# Patient Record
Sex: Male | Born: 1935 | Race: White | Hispanic: No | Marital: Married | Smoking: Former smoker
Health system: Southern US, Community
[De-identification: ages and names within clinical notes are randomized; demographics above are authoritative.]

## PROBLEM LIST (undated history)

## (undated) DIAGNOSIS — J45909 Unspecified asthma, uncomplicated: Secondary | ICD-10-CM

## (undated) DIAGNOSIS — F32A Depression, unspecified: Secondary | ICD-10-CM

## (undated) DIAGNOSIS — Z87898 Personal history of other specified conditions: Secondary | ICD-10-CM

## (undated) DIAGNOSIS — Z87448 Personal history of other diseases of urinary system: Secondary | ICD-10-CM

## (undated) DIAGNOSIS — Z955 Presence of coronary angioplasty implant and graft: Secondary | ICD-10-CM

## (undated) DIAGNOSIS — F329 Major depressive disorder, single episode, unspecified: Secondary | ICD-10-CM

## (undated) DIAGNOSIS — I10 Essential (primary) hypertension: Secondary | ICD-10-CM

## (undated) DIAGNOSIS — I519 Heart disease, unspecified: Secondary | ICD-10-CM

## (undated) DIAGNOSIS — I714 Abdominal aortic aneurysm, without rupture: Secondary | ICD-10-CM

## (undated) DIAGNOSIS — E7800 Pure hypercholesterolemia, unspecified: Secondary | ICD-10-CM

## (undated) HISTORY — DX: Personal history of other specified conditions: Z87.898

## (undated) HISTORY — DX: Essential (primary) hypertension: I10

## (undated) HISTORY — DX: Unspecified asthma, uncomplicated: J45.909

## (undated) HISTORY — PX: ABDOMINAL AORTIC ANEURYSM REPAIR: SUR1152

## (undated) HISTORY — PX: CORONARY ANGIOPLASTY WITH STENT PLACEMENT: SHX49

## (undated) HISTORY — DX: Personal history of other diseases of urinary system: Z87.448

## (undated) HISTORY — DX: Heart disease, unspecified: I51.9

## (undated) HISTORY — DX: Depression, unspecified: F32.A

## (undated) HISTORY — DX: Abdominal aortic aneurysm, without rupture: I71.4

## (undated) HISTORY — DX: Pure hypercholesterolemia, unspecified: E78.00

## (undated) HISTORY — DX: Presence of coronary angioplasty implant and graft: Z95.5

## (undated) HISTORY — DX: Major depressive disorder, single episode, unspecified: F32.9

---

## 2004-01-02 HISTORY — PX: TOTAL HIP ARTHROPLASTY: SHX124

## 2015-06-13 DIAGNOSIS — I10 Essential (primary) hypertension: Secondary | ICD-10-CM

## 2015-06-13 DIAGNOSIS — E7800 Pure hypercholesterolemia, unspecified: Secondary | ICD-10-CM

## 2015-06-13 DIAGNOSIS — Z955 Presence of coronary angioplasty implant and graft: Secondary | ICD-10-CM

## 2015-06-13 DIAGNOSIS — I714 Abdominal aortic aneurysm, without rupture, unspecified: Secondary | ICD-10-CM | POA: Insufficient documentation

## 2015-06-13 HISTORY — DX: Abdominal aortic aneurysm, without rupture: I71.4

## 2015-06-13 HISTORY — DX: Essential (primary) hypertension: I10

## 2015-06-13 HISTORY — DX: Presence of coronary angioplasty implant and graft: Z95.5

## 2015-06-13 HISTORY — DX: Abdominal aortic aneurysm, without rupture, unspecified: I71.40

## 2015-06-13 HISTORY — DX: Pure hypercholesterolemia, unspecified: E78.00

## 2015-06-24 ENCOUNTER — Encounter: Payer: Self-pay | Admitting: Urology

## 2015-06-24 ENCOUNTER — Ambulatory Visit (INDEPENDENT_AMBULATORY_CARE_PROVIDER_SITE_OTHER): Payer: Medicare Other | Admitting: Urology

## 2015-06-24 VITALS — BP 114/74 | HR 79 | Ht 70.0 in | Wt 211.9 lb

## 2015-06-24 DIAGNOSIS — R310 Gross hematuria: Secondary | ICD-10-CM

## 2015-06-24 DIAGNOSIS — R972 Elevated prostate specific antigen [PSA]: Secondary | ICD-10-CM | POA: Insufficient documentation

## 2015-06-24 DIAGNOSIS — N400 Enlarged prostate without lower urinary tract symptoms: Secondary | ICD-10-CM | POA: Insufficient documentation

## 2015-06-24 LAB — URINALYSIS, COMPLETE
BILIRUBIN UA: NEGATIVE
Glucose, UA: NEGATIVE
KETONES UA: NEGATIVE
LEUKOCYTES UA: NEGATIVE
Nitrite, UA: NEGATIVE
PROTEIN UA: NEGATIVE
Specific Gravity, UA: <1.005 — ABNORMAL LOW (ref 1.005–1.030)
UUROB: 0.2 mg/dL (ref 0.2–1.0)
pH, UA: 6 (ref 5.0–7.5)

## 2015-06-24 LAB — BLADDER SCAN AMB NON-IMAGING: Scan Result: 55

## 2015-06-24 LAB — MICROSCOPIC EXAMINATION
Bacteria, UA: NONE SEEN
Epithelial Cells (non renal): NONE SEEN /hpf (ref 0–10)
WBC, UA: NONE SEEN /hpf (ref 0–?)

## 2015-06-24 NOTE — Progress Notes (Signed)
06/24/2015 11:19 AM   Daralene Milchoundell Marling 04/26/1932 [id number]  Referring provider: No referring provider defined for this encounter.  Chief Complaint  Patient presents with  . Establish Care    New Patient    HPI: Mr Tiburcio PeaHarris is an 82yo see today to establish care. He was previously seen by a urologist for elevated PSA and is s/p biopsy x2 which are negative per patient. Last biopsy was 4 years ago. Pt does not recall his PSA values. He also has a hx of gross hematuria which per patient he had a negative workup 8 months ago.  He is on finasteride, terazosin, and flomax for BPH. He has nocturia 1-2x but no other LUTS.   He has been on finasteride for about 1 year due to the hematuria.    PMH: Past Medical History  Diagnosis Date  . Essential (primary) hypertension 06/13/2015  . Abdominal aortic aneurysm (AAA) without rupture (HCC) 06/13/2015  . Pure hypercholesterolemia 06/13/2015  . Presence of coronary angioplasty implant and graft 06/13/2015    Overview:  Prox RCA, 03/01/2008   . Asthma   . Depression   . Heart disease   . History of elevated PSA     x2 neg. biopsy x 4 yrs ago  . History of hematuria     cysto x 8mths ago, (-)neg. hematuria work      Surgical History: Past Surgical History  Procedure Laterality Date  . Total hip arthroplasty  2006  . Coronary angioplasty with stent placement    . Abdominal aortic aneurysm repair      Home Medications:    Medication List       This list is accurate as of: 06/24/15 11:19 AM.  Always use your most recent med list.               aspirin EC 81 MG tablet  Take by mouth.     atorvastatin 10 MG tablet  Commonly known as:  LIPITOR  Take by mouth.     busPIRone 5 MG tablet  Commonly known as:  BUSPAR  Take by mouth.     finasteride 5 MG tablet  Commonly known as:  PROSCAR  Take by mouth.     losartan 50 MG tablet  Commonly known as:  COZAAR  Take by mouth.     tamsulosin 0.4 MG Caps capsule  Commonly known  as:  FLOMAX  Take by mouth.     terazosin 2 MG capsule  Commonly known as:  HYTRIN  Take by mouth.        Allergies:  Allergies  Allergen Reactions  . Simvastatin     Other reaction(s): Unknown Patient unsure if medication give him a rash or a headache    Family History: Family History  Problem Relation Age of Onset  . Bladder Cancer Neg Hx   . Kidney cancer Neg Hx   . Prostate cancer Brother     Social History:  reports that he has quit smoking. He does not have any smokeless tobacco history on file. His alcohol and drug histories are not on file.  ROS: UROLOGY Frequent Urination?: No Hard to postpone urination?: No Burning/pain with urination?: No Get up at night to urinate?: Yes Leakage of urine?: No Urine stream starts and stops?: No Trouble starting stream?: No Do you have to strain to urinate?: No Blood in urine?: No Urinary tract infection?: No Sexually transmitted disease?: No Injury to kidneys or bladder?: No Painful intercourse?: No Weak stream?:  No Erection problems?: Yes Penile pain?: No  Gastrointestinal Nausea?: No Vomiting?: No Indigestion/heartburn?: No Diarrhea?: No Constipation?: No  Constitutional Fever: No Night sweats?: No Weight loss?: No Fatigue?: No  Skin Skin rash/lesions?: No Itching?: No  Eyes Blurred vision?: No Double vision?: No  Ears/Nose/Throat Sore throat?: No Sinus problems?: Yes  Hematologic/Lymphatic Swollen glands?: No Easy bruising?: No  Cardiovascular Leg swelling?: No Chest pain?: No  Respiratory Cough?: No Shortness of breath?: No  Endocrine Excessive thirst?: No  Musculoskeletal Back pain?: Yes Joint pain?: Yes  Neurological Headaches?: No Dizziness?: No  Psychologic Depression?: Yes Anxiety?: No  Physical Exam: BP 114/74 mmHg  Pulse 79  Ht 5\' 10"  (1.778 m)  Wt 96.117 kg (211 lb 14.4 oz)  BMI 30.40 kg/m2  Constitutional:  Alert and oriented, No acute distress. HEENT: [place]  AT, moist mucus membranes.  Trachea midline, no masses. Cardiovascular: No clubbing, cyanosis, or edema. Respiratory: Normal respiratory effort, no increased work of breathing. GI: Abdomen is soft, nontender, nondistended, no abdominal masses GU: No CVA tenderness.  Uncircumcised phallus, no masses/lesion on penis, testes, scrotum. Prostate >100g smooth, no induration no nodules. Normal rectal tone Skin: No rashes, bruises or suspicious lesions. Lymph: No cervical or inguinal adenopathy. Neurologic: Grossly intact, no focal deficits, moving all 4 extremities. Psychiatric: Normal mood and affect.  Laboratory Data: No results found for: WBC, HGB, HCT, MCV, PLT  No results found for: CREATININE  No results found for: PSA  No results found for: TESTOSTERONE  No results found for: HGBA1C  Urinalysis No results found for: COLORURINE, APPEARANCEUR, LABSPEC, PHURINE, GLUCOSEU, HGBUR, BILIRUBINUR, KETONESUR, PROTEINUR, UROBILINOGEN, NITRITE, LEUKOCYTESUR  Pertinent Imaging: none  Assessment & Plan:    1. BPH (benign prostatic hyperplasia) -Continue finasteride, flomax, terazosin - Urinalysis, Complete - Bladder Scan (Post Void Residual) in office  2. Gross hematuria -Ua today normal -Will request records from previous Dr. Alecia Lemmingeisman from [place].   3. Elevated PSA -check PSA today  No Follow-up on file.  Wilkie AyePatrick Paisely Brick, MD  [facility] [address], [address] [place], [place] [address] [phone]

## 2015-06-25 LAB — PSA: Prostate Specific Ag, Serum: 2.5 ng/mL (ref 0.0–4.0)

## 2015-09-26 ENCOUNTER — Other Ambulatory Visit: Payer: Self-pay

## 2015-09-26 DIAGNOSIS — R310 Gross hematuria: Secondary | ICD-10-CM

## 2015-09-27 ENCOUNTER — Other Ambulatory Visit: Payer: Medicare Other

## 2015-09-27 DIAGNOSIS — R310 Gross hematuria: Secondary | ICD-10-CM

## 2015-09-27 LAB — URINALYSIS, COMPLETE
Bilirubin, UA: NEGATIVE
GLUCOSE, UA: NEGATIVE
Ketones, UA: NEGATIVE
Leukocytes, UA: NEGATIVE
NITRITE UA: NEGATIVE
Protein, UA: NEGATIVE
RBC, UA: NEGATIVE
Specific Gravity, UA: 1.02 (ref 1.005–1.030)
Urobilinogen, Ur: 0.2 mg/dL (ref 0.2–1.0)
pH, UA: 5.5 (ref 5.0–7.5)

## 2015-09-27 LAB — MICROSCOPIC EXAMINATION
BACTERIA UA: NONE SEEN
Epithelial Cells (non renal): NONE SEEN /hpf (ref 0–10)
WBC, UA: NONE SEEN /hpf (ref 0–?)

## 2017-02-26 ENCOUNTER — Other Ambulatory Visit (HOSPITAL_COMMUNITY): Payer: Self-pay | Admitting: Family Medicine

## 2017-02-26 DIAGNOSIS — Z9889 Other specified postprocedural states: Secondary | ICD-10-CM

## 2017-02-26 DIAGNOSIS — Z136 Encounter for screening for cardiovascular disorders: Secondary | ICD-10-CM

## 2017-03-04 ENCOUNTER — Other Ambulatory Visit (HOSPITAL_COMMUNITY): Payer: Self-pay | Admitting: Family Medicine

## 2017-03-04 DIAGNOSIS — I10 Essential (primary) hypertension: Secondary | ICD-10-CM

## 2017-03-04 DIAGNOSIS — Z9889 Other specified postprocedural states: Secondary | ICD-10-CM

## 2017-03-05 ENCOUNTER — Ambulatory Visit
Admission: RE | Admit: 2017-03-05 | Discharge: 2017-03-05 | Disposition: A | Discharge status: Home / Self Care | Payer: Medicare Other | Source: Ambulatory Visit | Attending: Family Medicine | Admitting: Family Medicine

## 2017-03-05 ENCOUNTER — Encounter: Payer: Self-pay | Admitting: Radiology

## 2017-03-05 DIAGNOSIS — I714 Abdominal aortic aneurysm, without rupture: Secondary | ICD-10-CM | POA: Insufficient documentation

## 2017-03-05 DIAGNOSIS — Z136 Encounter for screening for cardiovascular disorders: Secondary | ICD-10-CM | POA: Diagnosis not present

## 2017-03-05 DIAGNOSIS — I10 Essential (primary) hypertension: Secondary | ICD-10-CM

## 2017-03-05 DIAGNOSIS — Z8679 Personal history of other diseases of the circulatory system: Secondary | ICD-10-CM | POA: Diagnosis present

## 2017-03-05 DIAGNOSIS — Z9889 Other specified postprocedural states: Secondary | ICD-10-CM

## 2019-04-10 ENCOUNTER — Ambulatory Visit: Payer: Medicare HMO | Attending: Internal Medicine

## 2019-04-10 DIAGNOSIS — Z23 Encounter for immunization: Secondary | ICD-10-CM

## 2019-04-10 NOTE — Progress Notes (Signed)
   Covid-19 Vaccination Clinic  Name:  Timothy Giles    MRN: [id number] DOB: April 21, 1932  04/10/2019  Timothy Giles was observed post Covid-19 immunization for 15 minutes without incident. He was provided with Vaccine Information Sheet and instruction to access the V-Safe system.   Timothy Giles was instructed to call 911 with any severe reactions post vaccine: Marland Kitchen Difficulty breathing  . Swelling of face and throat  . A fast heartbeat  . A bad rash all over body  . Dizziness and weakness   Immunizations Administered    Name Date Dose VIS Date Route   Pfizer COVID-19 Vaccine 04/10/2019  9:52 AM 0.3 mL 12/12/2018 Intramuscular   Manufacturer: [employer], [employer]   Lot: [id number]   NDC: [address]-2

## 2019-05-05 ENCOUNTER — Ambulatory Visit: Payer: Medicare HMO | Attending: Internal Medicine

## 2019-05-05 DIAGNOSIS — Z23 Encounter for immunization: Secondary | ICD-10-CM

## 2019-05-05 NOTE — Progress Notes (Signed)
   Covid-19 Vaccination Clinic  Name:  Akshith Moncus    MRN: [id number] DOB: 1932-10-25  05/05/2019  Mr. Vastine was observed post Covid-19 immunization for 15 minutes without incident. He was provided with Vaccine Information Sheet and instruction to access the V-Safe system.   Mr. Maslow was instructed to call 911 with any severe reactions post vaccine: Marland Kitchen Difficulty breathing  . Swelling of face and throat  . A fast heartbeat  . A bad rash all over body  . Dizziness and weakness   Immunizations Administered    Name Date Dose VIS Date Route   Pfizer COVID-19 Vaccine 05/05/2019  1:54 PM 0.3 mL 02/25/2018 Intramuscular   Manufacturer: [employer], [employer]   Lot: [id number]   NDC: [address]-9

## 2019-12-21 ENCOUNTER — Ambulatory Visit (INDEPENDENT_AMBULATORY_CARE_PROVIDER_SITE_OTHER): Payer: Medicare HMO

## 2019-12-21 ENCOUNTER — Other Ambulatory Visit: Payer: Self-pay

## 2019-12-21 ENCOUNTER — Ambulatory Visit
Admission: EM | Admit: 2019-12-21 | Discharge: 2019-12-21 | Disposition: A | Discharge status: Home / Self Care | Payer: Medicare HMO | Attending: Emergency Medicine | Admitting: Emergency Medicine

## 2019-12-21 DIAGNOSIS — I517 Cardiomegaly: Secondary | ICD-10-CM

## 2019-12-21 DIAGNOSIS — R059 Cough, unspecified: Secondary | ICD-10-CM

## 2019-12-21 DIAGNOSIS — J189 Pneumonia, unspecified organism: Secondary | ICD-10-CM

## 2019-12-21 MED ORDER — AMOXICILLIN-POT CLAVULANATE 875-125 MG PO TABS: 1.0000 | ORAL_TABLET | Freq: Two times a day (BID) | ORAL | 0 refills | Status: AC

## 2019-12-21 MED ORDER — PREDNISONE 20 MG PO TABS: 40.0000 mg | ORAL_TABLET | Freq: Every day | ORAL | 0 refills | Status: AC

## 2019-12-21 NOTE — ED Provider Notes (Signed)
Renaldo Fiddler    CSN: [id number] Arrival date & time: 12/21/19  1117      History   Chief Complaint Chief Complaint  Patient presents with  . Cough    HPI Ebon Sadlon is a 86 y.o. male.   Ell Dauria presents with complaints of cough for the past three days which is productive. Started out with congestion and bilateral eye tearing. Now more of a cough with yellow phlegm. Denies chest pain  Or shortness of breath . His wife is ill with cough as well. No history of covid-19 and has not received vaccination. States he uses an advair diskus daily for asthma. States he is unsure of what his baseline o2 sats are. No known fevers or chills. No gi symptoms.    ROS per HPI, negative if not otherwise mentioned.      Past Medical History:  Diagnosis Date  . Abdominal aortic aneurysm (AAA) without rupture (HCC) 06/13/2015  . Asthma   . Depression   . Essential (primary) hypertension 06/13/2015  . Heart disease   . History of elevated PSA    x2 neg. biopsy x 4 yrs ago  . History of hematuria    cysto x ago, (-)neg. hematuria work    . Presence of coronary angioplasty implant and graft 06/13/2015   Overview:  Prox RCA, 03/01/2008   . Pure hypercholesterolemia 06/13/2015    Patient Active Problem List   Diagnosis Date Noted  . BPH (benign prostatic hyperplasia) 06/24/2015  . Gross hematuria 06/24/2015  . Elevated PSA 06/24/2015  . Abdominal aortic aneurysm (AAA) without rupture (HCC) 06/13/2015  . Essential (primary) hypertension 06/13/2015  . Pure hypercholesterolemia 06/13/2015  . Presence of coronary angioplasty implant and graft 06/13/2015    Past Surgical History:  Procedure Laterality Date  . ABDOMINAL AORTIC ANEURYSM REPAIR    . CORONARY ANGIOPLASTY WITH STENT PLACEMENT    . TOTAL HIP ARTHROPLASTY  2006       Home Medications    Prior to Admission medications   Medication Sig Start Date End Date Taking? Authorizing Provider   amoxicillin-clavulanate (AUGMENTIN) 875-125 MG tablet Take 1 tablet by mouth every 12 (twelve) hours for 10 days. 12/21/19 12/31/19  Georgetta Haber, NP  aspirin EC 81 MG tablet Take by mouth.    [provider]  atorvastatin (LIPITOR) 10 MG tablet Take by mouth.    [provider]  busPIRone (BUSPAR) 5 MG tablet Take by mouth.    [provider]  finasteride (PROSCAR) 5 MG tablet Take by mouth.    [provider]  losartan (COZAAR) 50 MG tablet Take by mouth.    [provider]  predniSONE (DELTASONE) 20 MG tablet Take 2 tablets (40 mg total) by mouth daily with breakfast for 5 days. 12/21/19 12/26/19  Georgetta Haber, NP  tamsulosin (FLOMAX) 0.4 MG CAPS capsule Take by mouth.    [provider]  terazosin (HYTRIN) 2 MG capsule Take by mouth.    [provider]    Family History Family History  Problem Relation Age of Onset  . Prostate cancer Brother   . Bladder Cancer Neg Hx   . Kidney cancer Neg Hx     Social History Social History   Tobacco Use  . Smoking status: Former Games developer  . Smokeless tobacco: Never Used  Substance Use Topics  . Alcohol use: Never    Alcohol/week: 0.0 standard drinks  . Drug use: Never     Allergies  Simvastatin   Review of Systems Review of Systems   Physical Exam Triage Vital Signs ED Triage Vitals [12/21/19 1130]  Enc Vitals Group     BP (!) 144/81     Pulse Rate 89     Resp 18     Temp 98 F (36.7 C)     Temp Source Oral     SpO2 92 %     Weight 211 lb 13.8 oz (96.1 kg)     Height 5\' 10"  (1.778 m)     Head Circumference      Peak Flow      Pain Score 0     Pain Loc      Pain Edu?      Excl. in GC?    No data found.  Updated Vital Signs BP (!) 144/81   Pulse 89   Temp 98 F (36.7 C) (Oral)   Resp 18   Ht 5\' 10"  (1.778 m)   Wt 211 lb 13.8 oz (96.1 kg)   SpO2 92%   BMI 30.40 kg/m   Visual Acuity Right Eye Distance:   Left Eye Distance:   Bilateral  Distance:    Right Eye Near:   Left Eye Near:    Bilateral Near:     Physical Exam Constitutional:      General: He is not in acute distress.    Appearance: He is well-developed. He is not ill-appearing or toxic-appearing.  HENT:     Nose: Rhinorrhea present.  Cardiovascular:     Rate and Rhythm: Normal rate and regular rhythm.  Pulmonary:     Effort: Pulmonary effort is normal.     Breath sounds: Normal breath sounds.  Skin:    General: Skin is warm and dry.  Neurological:     Mental Status: He is alert and oriented to person, place, and time.      UC Treatments / Results  Labs (all labs ordered are listed, but only abnormal results are displayed) Labs Reviewed  COVID-19, FLU A+B NAA    EKG   Radiology No results found.  Procedures Procedures (including critical care time)  Medications Ordered in UC Medications - No data to display  Initial Impression / Assessment and Plan / UC Course  I have reviewed the triage vital signs and the nursing notes.  Pertinent labs & imaging results that were available during my care of the patient were reviewed by me and considered in my medical decision making (see chart for details).     Asthma history, per chart review with care everywhere last documented o2 with pcp in September of this year was 94%. No shortness of breath. No shortness of breath . bp and heart rate are stable, afebrile. Wife also ill. Covid testing pending and isolation instructions provided.  Diabetic with cardiac history as well, opted to provide empiric coverage with augmentin today, pneumonitis on cxr. 5 days of prednisone as well. Er precautions provided. Patient and wife verbalized understanding and agreeable to plan.  Ambulatory out of clinic without difficulty.   Final Clinical Impressions(s) / UC Diagnoses   Final diagnoses:  Cough  Pneumonitis     Discharge Instructions     Complete course of antibiotics.   5 days of prednisone as well.   Push fluids to ensure adequate hydration and keep secretions thin.  Over the counter  medications as needed for symptoms.  Self isolate until covid results are back and negative.  Will notify you by phone of  any positive findings. Your negative results will be sent through your MyChart.     Please return or go to the ER for any worsening of symptoms.     ED Prescriptions    Medication Sig Dispense Auth. Provider   amoxicillin-clavulanate (AUGMENTIN) 875-125 MG tablet Take 1 tablet by mouth every 12 (twelve) hours for 10 days. 20 tablet Linus Mako B, NP   predniSONE (DELTASONE) 20 MG tablet Take 2 tablets (40 mg total) by mouth daily with breakfast for 5 days. 10 tablet Georgetta Haber, NP     PDMP not reviewed this encounter.   Georgetta Haber, NP 12/21/19 1233

## 2019-12-21 NOTE — ED Triage Notes (Signed)
Pt reports having a productive cough x2 days. sts he has been taking mucinex q12. No known sick contacts.

## 2019-12-21 NOTE — Discharge Instructions (Signed)
Complete course of antibiotics.   5 days of prednisone as well.  Push fluids to ensure adequate hydration and keep secretions thin.  Over the counter  medications as needed for symptoms.  Self isolate until covid results are back and negative.  Will notify you by phone of any positive findings. Your negative results will be sent through your MyChart.     Please return or go to the ER for any worsening of symptoms.

## 2019-12-24 LAB — COVID-19, FLU A+B NAA
Influenza A, NAA: NOT DETECTED
Influenza B, NAA: NOT DETECTED
SARS-CoV-2, NAA: NOT DETECTED

## 2020-02-23 ENCOUNTER — Other Ambulatory Visit: Payer: Self-pay

## 2020-02-23 ENCOUNTER — Emergency Department: Payer: Medicare HMO

## 2020-02-23 ENCOUNTER — Inpatient Hospital Stay
Admission: EM | Admit: 2020-02-23 | Discharge: 2020-02-28 | DRG: 177 | Disposition: A | Discharge status: Home Health | Payer: Medicare HMO | Attending: Internal Medicine | Admitting: Internal Medicine

## 2020-02-23 ENCOUNTER — Inpatient Hospital Stay: Payer: Medicare HMO

## 2020-02-23 DIAGNOSIS — I4510 Unspecified right bundle-branch block: Secondary | ICD-10-CM | POA: Diagnosis present

## 2020-02-23 DIAGNOSIS — F419 Anxiety disorder, unspecified: Secondary | ICD-10-CM | POA: Diagnosis present

## 2020-02-23 DIAGNOSIS — R0902 Hypoxemia: Secondary | ICD-10-CM | POA: Diagnosis present

## 2020-02-23 DIAGNOSIS — I5021 Acute systolic (congestive) heart failure: Secondary | ICD-10-CM | POA: Diagnosis not present

## 2020-02-23 DIAGNOSIS — Z955 Presence of coronary angioplasty implant and graft: Secondary | ICD-10-CM

## 2020-02-23 DIAGNOSIS — I10 Essential (primary) hypertension: Secondary | ICD-10-CM | POA: Diagnosis present

## 2020-02-23 DIAGNOSIS — Z888 Allergy status to other drugs, medicaments and biological substances status: Secondary | ICD-10-CM | POA: Diagnosis not present

## 2020-02-23 DIAGNOSIS — J189 Pneumonia, unspecified organism: Secondary | ICD-10-CM | POA: Diagnosis present

## 2020-02-23 DIAGNOSIS — Z7982 Long term (current) use of aspirin: Secondary | ICD-10-CM

## 2020-02-23 DIAGNOSIS — N400 Enlarged prostate without lower urinary tract symptoms: Secondary | ICD-10-CM | POA: Diagnosis present

## 2020-02-23 DIAGNOSIS — F32A Depression, unspecified: Secondary | ICD-10-CM | POA: Diagnosis present

## 2020-02-23 DIAGNOSIS — I2510 Atherosclerotic heart disease of native coronary artery without angina pectoris: Secondary | ICD-10-CM | POA: Diagnosis present

## 2020-02-23 DIAGNOSIS — Z87891 Personal history of nicotine dependence: Secondary | ICD-10-CM | POA: Diagnosis not present

## 2020-02-23 DIAGNOSIS — R000 Tachycardia, unspecified: Secondary | ICD-10-CM | POA: Diagnosis present

## 2020-02-23 DIAGNOSIS — J1282 Pneumonia due to coronavirus disease 2019: Secondary | ICD-10-CM | POA: Diagnosis present

## 2020-02-23 DIAGNOSIS — E785 Hyperlipidemia, unspecified: Secondary | ICD-10-CM | POA: Diagnosis present

## 2020-02-23 DIAGNOSIS — Z79899 Other long term (current) drug therapy: Secondary | ICD-10-CM | POA: Diagnosis not present

## 2020-02-23 DIAGNOSIS — Z96649 Presence of unspecified artificial hip joint: Secondary | ICD-10-CM | POA: Diagnosis present

## 2020-02-23 DIAGNOSIS — E7800 Pure hypercholesterolemia, unspecified: Secondary | ICD-10-CM | POA: Diagnosis present

## 2020-02-23 DIAGNOSIS — J9601 Acute respiratory failure with hypoxia: Secondary | ICD-10-CM

## 2020-02-23 DIAGNOSIS — U071 COVID-19: Secondary | ICD-10-CM | POA: Diagnosis present

## 2020-02-23 DIAGNOSIS — J45901 Unspecified asthma with (acute) exacerbation: Secondary | ICD-10-CM | POA: Diagnosis present

## 2020-02-23 DIAGNOSIS — I714 Abdominal aortic aneurysm, without rupture: Secondary | ICD-10-CM | POA: Diagnosis present

## 2020-02-23 LAB — CBC WITH DIFFERENTIAL/PLATELET
Abs Immature Granulocytes: 0.05 10*3/uL (ref 0.00–0.07)
Basophils Absolute: 0 10*3/uL (ref 0.0–0.1)
Basophils Relative: 0 %
Eosinophils Absolute: 0.1 10*3/uL (ref 0.0–0.5)
Eosinophils Relative: 1 %
HCT: 42.9 % (ref 39.0–52.0)
Hemoglobin: 14.4 g/dL (ref 13.0–17.0)
Immature Granulocytes: 1 %
Lymphocytes Relative: 4 %
Lymphs Abs: 0.3 10*3/uL — ABNORMAL LOW (ref 0.7–4.0)
MCH: 27.9 pg (ref 26.0–34.0)
MCHC: 33.6 g/dL (ref 30.0–36.0)
MCV: 83.1 fL (ref 80.0–100.0)
Monocytes Absolute: 0.5 10*3/uL (ref 0.1–1.0)
Monocytes Relative: 7 %
Neutro Abs: 6.5 10*3/uL (ref 1.7–7.7)
Neutrophils Relative %: 87 %
Platelets: 242 10*3/uL (ref 150–400)
RBC: 5.16 MIL/uL (ref 4.22–5.81)
RDW: 14.1 % (ref 11.5–15.5)
WBC: 7.4 10*3/uL (ref 4.0–10.5)
nRBC: 0 % (ref 0.0–0.2)

## 2020-02-23 LAB — LACTIC ACID, PLASMA
Lactic Acid, Venous: 1.4 mmol/L (ref 0.5–1.9)
Lactic Acid, Venous: 1.2 mmol/L (ref 0.5–1.9)

## 2020-02-23 LAB — SARS CORONAVIRUS 2 (TAT 6-24 HRS): SARS Coronavirus 2: POSITIVE — AB

## 2020-02-23 LAB — BRAIN NATRIURETIC PEPTIDE: B Natriuretic Peptide: 236.8 pg/mL — ABNORMAL HIGH (ref 0.0–100.0)

## 2020-02-23 LAB — BASIC METABOLIC PANEL
Anion gap: 13 (ref 5–15)
BUN: 20 mg/dL (ref 8–23)
CO2: 18 mmol/L — ABNORMAL LOW (ref 22–32)
Calcium: 8.9 mg/dL (ref 8.9–10.3)
Chloride: 106 mmol/L (ref 98–111)
Creatinine, Ser: 1.07 mg/dL (ref 0.61–1.24)
GFR, Estimated: >60 mL/min (ref >60)
Glucose, Bld: 124 mg/dL — ABNORMAL HIGH (ref 70–99)
Potassium: 3.8 mmol/L (ref 3.5–5.1)
Sodium: 137 mmol/L (ref 135–145)

## 2020-02-23 LAB — STREP PNEUMONIAE URINARY ANTIGEN: Strep Pneumo Urinary Antigen: NEGATIVE

## 2020-02-23 LAB — EXPECTORATED SPUTUM ASSESSMENT W GRAM STAIN, RFLX TO RESP C

## 2020-02-23 LAB — PROCALCITONIN: Procalcitonin: 0.14 ng/mL

## 2020-02-23 LAB — FERRITIN: Ferritin: 1402 ng/mL — ABNORMAL HIGH (ref 24–336)

## 2020-02-23 LAB — D-DIMER, QUANTITATIVE: D-Dimer, Quant: 1.42 ug/mL-FEU — ABNORMAL HIGH (ref 0.00–0.50)

## 2020-02-23 MED ORDER — GUAIFENESIN ER 600 MG PO TB12
600.0000 mg | ORAL_TABLET | Freq: Two times a day (BID) | ORAL | Status: DC
  Administered 2020-02-23 – 2020-02-28 (×11): 600 mg via ORAL
  Filled 2020-02-23 (×11): qty 1

## 2020-02-23 MED ORDER — FINASTERIDE 5 MG PO TABS
5.0000 mg | ORAL_TABLET | Freq: Every day | ORAL | Status: DC
  Administered 2020-02-24 – 2020-02-28 (×5): 5 mg via ORAL
  Filled 2020-02-23 (×5): qty 1

## 2020-02-23 MED ORDER — BUSPIRONE HCL 5 MG PO TABS
5.0000 mg | ORAL_TABLET | Freq: Two times a day (BID) | ORAL | Status: DC
  Administered 2020-02-23 – 2020-02-28 (×10): 5 mg via ORAL
  Filled 2020-02-23 (×11): qty 1

## 2020-02-23 MED ORDER — ASPIRIN EC 81 MG PO TBEC
81.0000 mg | DELAYED_RELEASE_TABLET | Freq: Every day | ORAL | Status: DC
  Administered 2020-02-24 – 2020-02-28 (×4): 81 mg via ORAL
  Filled 2020-02-23 (×4): qty 1

## 2020-02-23 MED ORDER — ATORVASTATIN CALCIUM 10 MG PO TABS
10.0000 mg | ORAL_TABLET | Freq: Every day | ORAL | Status: DC
Start: 2020-02-23 — End: 2020-02-24
  Administered 2020-02-23: 10 mg via ORAL
  Filled 2020-02-23: qty 1

## 2020-02-23 MED ORDER — SODIUM CHLORIDE 0.9 % IV SOLN: INTRAVENOUS | Status: DC

## 2020-02-23 MED ORDER — ASPIRIN EC 81 MG PO TBEC: 81.0000 mg | DELAYED_RELEASE_TABLET | Freq: Every day | ORAL | Status: DC

## 2020-02-23 MED ORDER — LOSARTAN POTASSIUM 50 MG PO TABS
50.0000 mg | ORAL_TABLET | Freq: Every day | ORAL | Status: DC
  Administered 2020-02-24 – 2020-02-28 (×5): 50 mg via ORAL
  Filled 2020-02-23 (×5): qty 1

## 2020-02-23 MED ORDER — ENOXAPARIN SODIUM 40 MG/0.4ML ~~LOC~~ SOLN
40.0000 mg | SUBCUTANEOUS | Status: DC
  Administered 2020-02-23 – 2020-02-27 (×5): 40 mg via SUBCUTANEOUS
  Filled 2020-02-23 (×5): qty 0.4

## 2020-02-23 MED ORDER — TAMSULOSIN HCL 0.4 MG PO CAPS
0.4000 mg | ORAL_CAPSULE | Freq: Every day | ORAL | Status: DC
Start: 2020-02-24 — End: 2020-02-28
  Administered 2020-02-24 – 2020-02-28 (×5): 0.4 mg via ORAL
  Filled 2020-02-23 (×5): qty 1

## 2020-02-23 MED ORDER — ACETAMINOPHEN 325 MG PO TABS
650.0000 mg | ORAL_TABLET | Freq: Four times a day (QID) | ORAL | Status: DC | PRN
  Administered 2020-02-26 (×2): 650 mg via ORAL
  Filled 2020-02-23 (×3): qty 2

## 2020-02-23 MED ORDER — ONDANSETRON HCL 4 MG/2ML IJ SOLN: 4.0000 mg | Freq: Four times a day (QID) | INTRAMUSCULAR | Status: DC | PRN

## 2020-02-23 MED ORDER — TRAZODONE HCL 50 MG PO TABS
25.0000 mg | ORAL_TABLET | Freq: Every evening | ORAL | Status: DC | PRN
  Administered 2020-02-23 – 2020-02-25 (×3): 25 mg via ORAL
  Filled 2020-02-23 (×3): qty 1

## 2020-02-23 MED ORDER — ACETAMINOPHEN 650 MG RE SUPP: 650.0000 mg | Freq: Four times a day (QID) | RECTAL | Status: DC | PRN

## 2020-02-23 MED ORDER — METHYLPREDNISOLONE SODIUM SUCC 125 MG IJ SOLR
90.0000 mg | Freq: Two times a day (BID) | INTRAMUSCULAR | Status: DC
  Administered 2020-02-23 – 2020-02-27 (×8): 90 mg via INTRAVENOUS
  Filled 2020-02-23 (×8): qty 2

## 2020-02-23 MED ORDER — IOHEXOL 350 MG/ML SOLN
75.0000 mL | Freq: Once | INTRAVENOUS | Status: AC | PRN
  Administered 2020-02-23: 75 mL via INTRAVENOUS

## 2020-02-23 MED ORDER — LACTATED RINGERS IV BOLUS
500.0000 mL | Freq: Once | INTRAVENOUS | Status: AC
  Administered 2020-02-23: 500 mL via INTRAVENOUS

## 2020-02-23 MED ORDER — METHYLPREDNISOLONE SODIUM SUCC 40 MG IJ SOLR: 40.0000 mg | Freq: Three times a day (TID) | INTRAMUSCULAR | Status: DC

## 2020-02-23 MED ORDER — MAGNESIUM HYDROXIDE 400 MG/5ML PO SUSP: 30.0000 mL | Freq: Every day | ORAL | Status: DC | PRN

## 2020-02-23 MED ORDER — ONDANSETRON HCL 4 MG PO TABS: 4.0000 mg | ORAL_TABLET | Freq: Four times a day (QID) | ORAL | Status: DC | PRN

## 2020-02-23 MED ORDER — AZITHROMYCIN 500 MG IV SOLR
500.0000 mg | INTRAVENOUS | Status: DC
  Filled 2020-02-23: qty 500

## 2020-02-23 MED ORDER — METHYLPREDNISOLONE SODIUM SUCC 125 MG IJ SOLR: 90.0000 mg | INTRAMUSCULAR | Status: DC

## 2020-02-23 MED ORDER — SODIUM CHLORIDE 0.9 % IV SOLN
500.0000 mg | Freq: Once | INTRAVENOUS | Status: AC
  Administered 2020-02-23: 500 mg via INTRAVENOUS
  Filled 2020-02-23: qty 500

## 2020-02-23 MED ORDER — METHYLPREDNISOLONE SODIUM SUCC 125 MG IJ SOLR
80.0000 mg | INTRAMUSCULAR | Status: AC
  Administered 2020-02-23: 80 mg via INTRAVENOUS
  Filled 2020-02-23: qty 2

## 2020-02-23 MED ORDER — SODIUM CHLORIDE 0.9 % IV SOLN
2.0000 g | Freq: Once | INTRAVENOUS | Status: AC
  Administered 2020-02-23: 2 g via INTRAVENOUS
  Filled 2020-02-23: qty 20

## 2020-02-23 MED ORDER — SODIUM CHLORIDE 0.9 % IV SOLN
2.0000 g | INTRAVENOUS | Status: DC
Start: 2020-02-24 — End: 2020-02-24
  Filled 2020-02-23: qty 20

## 2020-02-23 MED ORDER — ALBUTEROL SULFATE HFA 108 (90 BASE) MCG/ACT IN AERS
2.0000 | INHALATION_SPRAY | RESPIRATORY_TRACT | Status: DC | PRN
  Filled 2020-02-23: qty 6.7

## 2020-02-23 MED ORDER — TERAZOSIN HCL 2 MG PO CAPS
2.0000 mg | ORAL_CAPSULE | Freq: Every day | ORAL | Status: DC
  Administered 2020-02-24 – 2020-02-27 (×4): 2 mg via ORAL
  Filled 2020-02-23 (×5): qty 1

## 2020-02-23 MED ORDER — IPRATROPIUM-ALBUTEROL 20-100 MCG/ACT IN AERS
2.0000 | INHALATION_SPRAY | Freq: Four times a day (QID) | RESPIRATORY_TRACT | Status: DC
  Administered 2020-02-23 – 2020-02-27 (×17): 2 via RESPIRATORY_TRACT
  Filled 2020-02-23 (×2): qty 4

## 2020-02-23 NOTE — Progress Notes (Signed)
Following for code sepsis 

## 2020-02-23 NOTE — ED Provider Notes (Signed)
[facility] Emergency Department Provider Note   ____________________________________________   Event Date/Time   First MD Initiated Contact with Patient 02/23/20 1143     (approximate)  I have reviewed the triage vital signs and the nursing notes.   HISTORY  Chief Complaint Shortness of Breath    HPI Timothy Giles is a 87 y.o. male reports for about 4 days been having a cough occasional light yellow phlegm production and shortness of breath. Seems to be worsening over 4 days. Wanted to see his primary care yesterday, but not able to get to the office at that point  He is wife just left the hospital with Covid and is just ending quarantine.  He does report a history of having asthma as well but not wheezing. No chest pain or shortness of breath except when he takes a deep breath still feel bit of an aching in his chest. Did not eat a breakfast today  No abdominal pain no nausea vomiting no diarrhea.  Does not think he is been vaccinated against Covid   Past Medical History:  Diagnosis Date  . Abdominal aortic aneurysm (AAA) without rupture (HCC) 06/13/2015  . Asthma   . Depression   . Essential (primary) hypertension 06/13/2015  . Heart disease   . History of elevated PSA    x2 neg. biopsy x 4 yrs ago  . History of hematuria    cysto x ago, (-)neg. hematuria work    . Presence of coronary angioplasty implant and graft 06/13/2015   Overview:  Prox RCA, 03/01/2008   . Pure hypercholesterolemia 06/13/2015    Patient Active Problem List   Diagnosis Date Noted  . CAP (community acquired pneumonia) 02/23/2020  . BPH (benign prostatic hyperplasia) 06/24/2015  . Gross hematuria 06/24/2015  . Elevated PSA 06/24/2015  . Abdominal aortic aneurysm (AAA) without rupture (HCC) 06/13/2015  . Essential (primary) hypertension 06/13/2015  . Pure hypercholesterolemia 06/13/2015  . Presence of coronary angioplasty implant and graft 06/13/2015    Past  Surgical History:  Procedure Laterality Date  . ABDOMINAL AORTIC ANEURYSM REPAIR    . CORONARY ANGIOPLASTY WITH STENT PLACEMENT    . TOTAL HIP ARTHROPLASTY  2006    Prior to Admission medications   Medication Sig Start Date End Date Taking? Authorizing Provider  aspirin EC 81 MG tablet Take by mouth.    [provider]  atorvastatin (LIPITOR) 10 MG tablet Take by mouth.    [provider]  busPIRone (BUSPAR) 5 MG tablet Take by mouth.    [provider]  finasteride (PROSCAR) 5 MG tablet Take by mouth.    [provider]  losartan (COZAAR) 50 MG tablet Take by mouth.    [provider]  tamsulosin (FLOMAX) 0.4 MG CAPS capsule Take by mouth.    [provider]  terazosin (HYTRIN) 2 MG capsule Take by mouth.    [provider]    Allergies Simvastatin  Family History  Problem Relation Age of Onset  . Prostate cancer Brother   . Bladder Cancer Neg Hx   . Kidney cancer Neg Hx     Social History Social History   Tobacco Use  . Smoking status: Former Games developer  . Smokeless tobacco: Never Used  Substance Use Topics  . Alcohol use: Never    Alcohol/week: 0.0 standard drinks  . Drug use: Never    Review of Systems Constitutional: Fatigue felt like he was having a fever yesterday Eyes: No visual changes.  ENT: No sore throat. Cardiovascular: Denies chest pain except if he takes a deep breath it feels achy in his chest. Respiratory: Mild shortness of breath productive cough Gastrointestinal: No abdominal pain.   Genitourinary: Negative for dysuria. Musculoskeletal: Negative for back pain. Neurological: Negative for headache    ____________________________________________   PHYSICAL EXAM:  VITAL SIGNS: ED Triage Vitals  Enc Vitals Group     BP 02/23/20 1129 92/68     Pulse Rate 02/23/20 1129 86     Resp 02/23/20 1129 20     Temp 02/23/20 1129 97.9 F (36.6 C)     Temp Source 02/23/20 1129 Oral     SpO2  02/23/20 1129 91 %     Weight 02/23/20 1136 200 lb (90.7 kg)     Height 02/23/20 1136 5\' 10"  (1.778 m)     Head Circumference --      Peak Flow --      Pain Score 02/23/20 1136 0     Pain Loc --      Pain Edu? --      Excl. in GC? --     Constitutional: Alert and oriented. Well appearing and in no acute distress. Is very hard of hearing mostly able to 'read lips' as he reports though can hear some out of his left ear when spoken loudly Eyes: Conjunctivae are normal. Head: Atraumatic. Nose: No congestion/rhinnorhea. Mouth/Throat: Mucous membranes are slightly dry. Neck: No stridor.  Cardiovascular: Normal rate, regular rhythm. Grossly normal heart sounds.  Good peripheral circulation. Respiratory: Normal respiratory effort.  No retractions. Lungs CTAB. Gastrointestinal: Soft and nontender. No distention. Musculoskeletal: No lower extremity tenderness nor edema. Neurologic:  Normal speech and language. No gross focal neurologic deficits are appreciated.  Skin:  Skin is warm, dry and intact. No rash noted. Psychiatric: Mood and affect are normal. Speech and behavior are normal.  ____________________________________________   LABS (all labs ordered are listed, but only abnormal results are displayed)  Labs Reviewed  BASIC METABOLIC PANEL - Abnormal; Notable for the following components:      Result Value   CO2 18 (*)    Glucose, Bld 124 (*)    All other components within normal limits  CBC WITH DIFFERENTIAL/PLATELET - Abnormal; Notable for the following components:   Lymphs Abs 0.3 (*)    All other components within normal limits  BRAIN NATRIURETIC PEPTIDE - Abnormal; Notable for the following components:   B Natriuretic Peptide 236.8 (*)    All other components within normal limits  CULTURE, BLOOD (ROUTINE X 2)  CULTURE, BLOOD (ROUTINE X 2)  SARS CORONAVIRUS 2 (TAT 6-24 HRS)  EXPECTORATED SPUTUM ASSESSMENT W GRAM STAIN, RFLX TO RESP C  LACTIC ACID, PLASMA  LACTIC ACID,  PLASMA  PROCALCITONIN  LEGIONELLA PNEUMOPHILA SEROGP 1 UR AG  STREP PNEUMONIAE URINARY ANTIGEN  FERRITIN  LACTATE DEHYDROGENASE  D-DIMER, QUANTITATIVE  POC SARS CORONAVIRUS 2 AG -  ED   ____________________________________________  EKG  Reviewed interpreted by me at 1130 Heart rate 95 QRS 140 QTc 500 Sinus rhythm with occasional PVCs with associated pause. No evidence of acute ischemia though suspect underlying right bundle branch block without obvious evidence of ischemia ____________________________________________  RADIOLOGY  DG Chest 2 View  Result Date: 02/23/2020 CLINICAL DATA:  Shortness of breath EXAM: CHEST - 2 VIEW COMPARISON:  Chest radiograph December 21, 2019 FINDINGS: Unchanged cardiomegaly. Tortuous thoracic aorta with aortic atherosclerosis. Right-sided basilar predominant interstitial opacities. No significant pleural effusion or visible pneumothorax. The visualized skeletal  structures are unchanged. IMPRESSION: Cardiomegaly with basilar predominant interstitial opacities, favor pulmonary edema. Electronically Signed   By: Maudry Mayhew MD   On: 02/23/2020 12:20    Imaging reviewed, radiologist the notes possible by basilar interstitial opacities possibly pulmonary edema.  See my separate note on chest x-ray read ____________________________________________   PROCEDURES  Procedure(s) performed: None  Procedures  Critical Care performed: No  ____________________________________________   INITIAL IMPRESSION / ASSESSMENT AND PLAN / ED COURSE  Pertinent labs & imaging results that were available during my care of the patient were reviewed by me and considered in my medical decision making (see chart for details).   Patient presents with cough slightly productive shortness of breath mild hypotension and a new oxygen requirement. His lungs are clear but has a history of asthma, will trial steroids as well as some and albuterol to see if this does provide some  improvement though I am concerned he may have COVID-19 or associated pneumonia based on his clinical history. He denies any significant chest pain other than achiness with deep inspiration, and given his exposure to Covid pretty suspect this may represent COVID-19. Other etiologies all considered though, no significant tachycardia, not complaining of an obvious severe pleuritic pain, doubt clinically pulmonary embolism, no signs or symptoms of DVT on exam or by clinical history.    Clinical Course as of 02/23/20 1337  Tue Feb 23, 2020  1216 Pending reviewed by the radiologist, the patient's chest x-ray seems to have a diffuse almost reticular-like markings and infiltrative patterns in the lungs.  I am concerned that he may have Covid, but also concerned there could be associated superinfection.  Will order azithromycin and ceftriaxone as well.  Procalcitonin pending [MQ]  1245 Agree with radiologist that lung markings appear more prominent in the bibasilar regions and could represent edema though the patient has a normal reported EF minus last cardiac visit, and I do not see a known history of congestive heart failure.  He does not appear volume overloaded or edematous by exam either.  I suspect atypical pulmonary infection may be etiology, will hydrate gently based on clinical history and cover broadly with antibiotics await Covid testing.  I am not at this point convinced that this would represent CHF with edema but rather an leaning towards infectious etiology although the patient's white count is normal and he is afebrile at this time he reports a fever yesterday. [MQ]    Clinical Course User Index [MQ] Sharyn Creamer, MD   ----------------------------------------- 1:36 PM on 02/23/2020 -----------------------------------------  Patient admitted to hospitalist Dr. Mertie Moores.  At this point do not have Covid test result back as we briefly ran out of reagent for testing.  Discussed that with Dr. Mertie Moores as  well.  Patient agreeable understand plan for admission.  Being admitted at this point with undifferentiated cause of dyspnea felt likely to represent community-acquired pneumonia, possible Covid, or CHF with further testing pending.  ____________________________________________   FINAL CLINICAL IMPRESSION(S) / ED DIAGNOSES  Final diagnoses:  Hypoxia        Note:  This document was prepared using Dragon voice recognition software and may include unintentional dictation errors       Sharyn Creamer, MD 02/23/20 1337

## 2020-02-23 NOTE — ED Triage Notes (Signed)
Pt comes via EMS with c/o SOB for 3 days and weakness. Pt out of hospital recently dx with COVID.   90% on RA- 93% on 3L [place],  CBG-126 Temp-97.8 BP-102/58 HR-88 20 g IV in place

## 2020-02-23 NOTE — ED Triage Notes (Signed)
Pt comes into the ED via EMS from home with c/o SOB, cough, body aches for the past 3-4 days.. states he wife was recently dx with covid and in the hospital..

## 2020-02-23 NOTE — ED Notes (Signed)
IP RN Claris Gower advising ready for patient.

## 2020-02-23 NOTE — Progress Notes (Signed)
CODE SEPSIS - PHARMACY COMMUNICATION  **Broad Spectrum Antibiotics should be administered within 1 hour of Sepsis diagnosis**  Time Code Sepsis Called/Page Received: 2/22 1217  Antibiotics Ordered: Ctx/Azith  Time of 1st antibiotic administration: 1311  Additional action taken by pharmacy:  Messaged RN re timing of abx  If necessary, Name of Provider/Nurse Contacted: Tammy Jude    Angelique Blonder ,PharmD Clinical Pharmacist  02/23/2020  2:02 PM

## 2020-02-23 NOTE — ED Notes (Signed)
Secure chat sent to receiving IP RN at this time.

## 2020-02-23 NOTE — H&P (Signed)
PATIENT NAME: Timothy Giles    MR#:  [id number]  DATE OF BIRTH:  09/13/1932  DATE OF ADMISSION:  02/23/2020  PRIMARY CARE PHYSICIAN: Jerl Mina, MD   Patient is coming from: Home REQUESTING/REFERRING PHYSICIAN: Sharyn Creamer, MD CHIEF COMPLAINT:   Chief Complaint  Patient presents with  . Shortness of Breath    HISTORY OF PRESENT ILLNESS:  Timothy Giles is a 87 y.o. Caucasian male with medical history significant for asthma, depression, hypertension, coronary artery disease status post PCI and stent placement, who presented with acute onset of worsening dyspnea and cough occasionally productive of thick white sputum.  He admitted to occasional wheezing with his symptoms. Yesterday she had tactile fever but has not measured it.  His wife was recently hospitalized for COVID-19 for 10 days and was just discharged.  He admits to fever and and chills 3 days ago.  He denied any loss of taste or smell.  No nausea or vomiting or diarrhea or abdominal pain.  He denies any lower extremity edema, orthopnea or paroxysmal nocturnal dyspnea.  He denies any previous history of CHF.  No dysuria, oliguria or hematuria or flank pain.  ED Course: When he came to the ER vital signs were within normal.  Pulse oximetry dropped down to 87% on room air and was up to 94% on 2 L of O2 by nasal cannula and was later increased to 3 L.  Labs revealed CO2 of 18 and otherwise unremarkable BMP.  Lactic acid was 1.4 and later 1.2 and CBC was within normal.  COVID-19 PCR is pending.  Blood cultures were drawn.   EKG as reviewed by me :Showed sinus rhythm with rate of 94 with marked sinus arrhythmia frequent PVCs, right bundle branch block and Q waves inferiorly Imaging:Two-view chest x-ray showed cardiomegaly with basilar predominant interstitial opacities favoring pulmonary edema.  I cannot rule out atypical/viral pneumonia per my review.  The patient was given 2 puffs of albuterol MDI, IV  Rocephin and Zithromax, 500 mL IV lactated Ringer and 80 mg of IV Solu-Medrol.  He will be admitted to a progressive unit bed for further evaluation and management.  PAST MEDICAL HISTORY:   Past Medical History:  Diagnosis Date  . Abdominal aortic aneurysm (AAA) without rupture (HCC) 06/13/2015  . Asthma   . Depression   . Essential (primary) hypertension 06/13/2015  . Heart disease   . History of elevated PSA    x2 neg. biopsy x 4 yrs ago  . History of hematuria    cysto x ago, (-)neg. hematuria work    . Presence of coronary angioplasty implant and graft 06/13/2015   Overview:  Prox RCA, 03/01/2008   . Pure hypercholesterolemia 06/13/2015    PAST SURGICAL HISTORY:   Past Surgical History:  Procedure Laterality Date  . ABDOMINAL AORTIC ANEURYSM REPAIR    . CORONARY ANGIOPLASTY WITH STENT PLACEMENT    . TOTAL HIP ARTHROPLASTY  2006    SOCIAL HISTORY:   Social History   Tobacco Use  . Smoking status: Former Games developer  . Smokeless tobacco: Never Used  Substance Use Topics  . Alcohol use: Never    Alcohol/week: 0.0 standard drinks    FAMILY HISTORY:   Family History  Problem Relation Age of Onset  . Prostate cancer Brother   . Bladder Cancer Neg Hx   . Kidney cancer Neg Hx     DRUG ALLERGIES:   Allergies  Allergen Reactions  .  Simvastatin     Other reaction(s): Unknown Patient unsure if medication give him a rash or a headache    REVIEW OF SYSTEMS:   ROS As per history of present illness. All pertinent systems were reviewed above. Constitutional, HEENT, cardiovascular, respiratory, GI, GU, musculoskeletal, neuro, psychiatric, endocrine, integumentary and hematologic systems were reviewed and are otherwise negative/unremarkable except for positive findings mentioned above in the HPI.   MEDICATIONS AT HOME:   Prior to Admission medications   Medication Sig Start Date End Date Taking? Authorizing Provider  aspirin EC 81 MG tablet Take by mouth.    [provider]  atorvastatin (LIPITOR) 10 MG tablet Take by mouth.    [provider]  busPIRone (BUSPAR) 5 MG tablet Take by mouth.    [provider]  finasteride (PROSCAR) 5 MG tablet Take by mouth.    [provider]  losartan (COZAAR) 50 MG tablet Take by mouth.    [provider]  tamsulosin (FLOMAX) 0.4 MG CAPS capsule Take by mouth.    [provider]  terazosin (HYTRIN) 2 MG capsule Take by mouth.    [provider]      VITAL SIGNS:  Blood pressure 92/68, pulse 86, temperature 97.9 F (36.6 C), temperature source Oral, resp. rate 20, height 5\' 10"  (1.778 m), weight 90.7 kg, SpO2 94 %.  PHYSICAL EXAMINATION:  Physical Exam  GENERAL:  87 y.o.-year-old Caucasian male patient lying in the bed with mild respiratory distress with conversational dyspnea. EYES: Pupils equal, round, reactive to light and accommodation. No scleral icterus. Extraocular muscles intact.  HEENT: Head atraumatic, normocephalic. Oropharynx and nasopharynx clear.  NECK:  Supple, no jugular venous distention. No thyroid enlargement, no tenderness.  LUNGS: Diminished bibasal breath sounds with bibasal crackles and occasional expiratory rhonchi and wheezes. CARDIOVASCULAR: Regular rate and rhythm, S1, S2 normal. No murmurs, rubs, or gallops.  ABDOMEN: Soft, nondistended, nontender. Bowel sounds present. No organomegaly or mass.  EXTREMITIES: No pedal edema, cyanosis, or clubbing.  NEUROLOGIC: Cranial nerves II through XII are intact. Muscle strength 5/5 in all extremities. Sensation intact. Gait not checked.  PSYCHIATRIC: The patient is alert and oriented x 3.  Normal affect and good eye contact. SKIN: No obvious rash, lesion, or ulcer.   LABORATORY PANEL:   CBC Recent Labs  Lab 02/23/20 1142  WBC 7.4  HGB 14.4  HCT 42.9  PLT 242    ------------------------------------------------------------------------------------------------------------------  Chemistries  Recent Labs  Lab 02/23/20 1142  NA 137  K 3.8  CL 106  CO2 18*  GLUCOSE 124*  BUN 20  CREATININE 1.07  CALCIUM 8.9   ------------------------------------------------------------------------------------------------------------------  Cardiac Enzymes No results for input(s): TROPONINI in the last 168 hours. ------------------------------------------------------------------------------------------------------------------  RADIOLOGY:  DG Chest 2 View  Result Date: 02/23/2020 CLINICAL DATA:  Shortness of breath EXAM: CHEST - 2 VIEW COMPARISON:  Chest radiograph December 21, 2019 FINDINGS: Unchanged cardiomegaly. Tortuous thoracic aorta with aortic atherosclerosis. Right-sided basilar predominant interstitial opacities. No significant pleural effusion or visible pneumothorax. The visualized skeletal structures are unchanged. IMPRESSION: Cardiomegaly with basilar predominant interstitial opacities, favor pulmonary edema. Electronically Signed   By: December 23, 2019 MD   On: 02/23/2020 12:20      IMPRESSION AND PLAN:  Active Problems:   CAP (community acquired pneumonia)  1.  Community-acquired pneumonia with subsequent acute hypoxic respiratory failure.  The patient has underlying asthma with subsequent mild exacerbation. -The patient will be admitted to a progressive unit bed. -We will continue antibiotic therapy with  IV Rocephin and Zithromax. -We will await COVID-19 PCR and if positive will start the patient on IV remdesivir.  -We will continue steroid therapy at this time given asthma exacerbation. -We will add mucolytic therapy as well as bronchodilator therapy with Combivent MDI. -We will follow blood and sputum culture. -We will add inflammatory markers. -We will continue  2.  Possible acute CHF. -BNP came back to 236.8.  We will obtain a chest  CTA for further assessment especially to rule out acute PE that could be contributing to his acute hypoxic respiratory failure. -We will hold off diuresis and further work-up till then.  3.  Dyslipidemia. -We will continue statin therapy.  4.  Coronary artery disease. -We will continue as needed sublingual nitroglycerin, aspirin and statin therapy.  5.  BPH. -We will continue Flomax and Hytrin.  6.  Anxiety. -We will continue BuSpar.  DVT prophylaxis: Lovenox. Code Status: full code. Family Communication:  The plan of care was discussed in details with the patient (and family). I answered all questions. The patient agreed to proceed with the above mentioned plan. Further management will depend upon hospital course. Disposition Plan: Back to previous home environment Consults called: none. All the records are reviewed and case discussed with ED provider.  Status is: Inpatient  Remains inpatient appropriate because:Ongoing diagnostic testing needed not appropriate for outpatient work up, Unsafe d/c plan, IV treatments appropriate due to intensity of illness or inability to take PO and Inpatient level of care appropriate due to severity of illness   Dispo: The patient is from: Home              Anticipated d/c is to: Home              Anticipated d/c date is: 3 days              Patient currently is not medically stable to d/c.   Difficult to place patient No   TOTAL TIME TAKING CARE OF THIS PATIENT: 55 minutes.    Hannah Beat M.D on 02/23/2020 at 1:21 PM  [facility]   From 7 PM-7 AM, contact night-coverage [email or web address]  CC: Primary care physician; Jerl Mina, MD

## 2020-02-24 DIAGNOSIS — J1282 Pneumonia due to coronavirus disease 2019: Secondary | ICD-10-CM

## 2020-02-24 DIAGNOSIS — U071 COVID-19: Principal | ICD-10-CM

## 2020-02-24 DIAGNOSIS — R0902 Hypoxemia: Secondary | ICD-10-CM | POA: Diagnosis not present

## 2020-02-24 LAB — BASIC METABOLIC PANEL
Anion gap: 11 (ref 5–15)
BUN: 21 mg/dL (ref 8–23)
CO2: 20 mmol/L — ABNORMAL LOW (ref 22–32)
Calcium: 8.8 mg/dL — ABNORMAL LOW (ref 8.9–10.3)
Chloride: 107 mmol/L (ref 98–111)
Creatinine, Ser: 0.92 mg/dL (ref 0.61–1.24)
GFR, Estimated: >60 mL/min (ref >60)
Glucose, Bld: 145 mg/dL — ABNORMAL HIGH (ref 70–99)
Potassium: 3.9 mmol/L (ref 3.5–5.1)
Sodium: 138 mmol/L (ref 135–145)

## 2020-02-24 LAB — CBC
HCT: 42.1 % (ref 39.0–52.0)
Hemoglobin: 14 g/dL (ref 13.0–17.0)
MCH: 27.9 pg (ref 26.0–34.0)
MCHC: 33.3 g/dL (ref 30.0–36.0)
MCV: 84 fL (ref 80.0–100.0)
Platelets: 274 10*3/uL (ref 150–400)
RBC: 5.01 MIL/uL (ref 4.22–5.81)
RDW: 13.9 % (ref 11.5–15.5)
WBC: 4.4 10*3/uL (ref 4.0–10.5)
nRBC: 0 % (ref 0.0–0.2)

## 2020-02-24 LAB — LACTATE DEHYDROGENASE: LDH: 205 U/L — ABNORMAL HIGH (ref 98–192)

## 2020-02-24 LAB — C-REACTIVE PROTEIN: CRP: 16 mg/dL — ABNORMAL HIGH (ref <1.0)

## 2020-02-24 LAB — LEGIONELLA PNEUMOPHILA SEROGP 1 UR AG: L. pneumophila Serogp 1 Ur Ag: NEGATIVE

## 2020-02-24 LAB — BRAIN NATRIURETIC PEPTIDE: B Natriuretic Peptide: 347.1 pg/mL — ABNORMAL HIGH (ref 0.0–100.0)

## 2020-02-24 MED ORDER — SODIUM CHLORIDE 0.9 % IV SOLN
200.0000 mg | Freq: Once | INTRAVENOUS | Status: AC
  Administered 2020-02-24: 200 mg via INTRAVENOUS
  Filled 2020-02-24: qty 200

## 2020-02-24 MED ORDER — SODIUM CHLORIDE 0.9 % IV SOLN
100.0000 mg | Freq: Every day | INTRAVENOUS | Status: AC
  Administered 2020-02-25 – 2020-02-28 (×4): 100 mg via INTRAVENOUS
  Filled 2020-02-24 (×4): qty 100

## 2020-02-24 MED ORDER — ALBUTEROL SULFATE HFA 108 (90 BASE) MCG/ACT IN AERS
2.0000 | INHALATION_SPRAY | RESPIRATORY_TRACT | Status: DC | PRN
  Filled 2020-02-24: qty 6.7

## 2020-02-24 MED ORDER — ASCORBIC ACID 500 MG PO TABS
500.0000 mg | ORAL_TABLET | Freq: Every day | ORAL | Status: DC
  Administered 2020-02-24 – 2020-02-28 (×4): 500 mg via ORAL
  Filled 2020-02-24 (×5): qty 1

## 2020-02-24 MED ORDER — ZINC SULFATE 220 (50 ZN) MG PO CAPS
220.0000 mg | ORAL_CAPSULE | Freq: Every day | ORAL | Status: DC
  Administered 2020-02-24 – 2020-02-28 (×4): 220 mg via ORAL
  Filled 2020-02-24 (×5): qty 1

## 2020-02-24 NOTE — Progress Notes (Signed)
Timothy Giles  JQB:[id number] DOB: 1932-01-03 DOA: 02/23/2020 PCP: Jerl Mina, MD    Brief Narrative:  87 year old with a history of asthma, depression, HTN, and CAD status post PCI with stent who presented to the ER with acute onset of worsening shortness of breath and cough.  His wife had recently been hospitalized for Covid pneumonia.  Patient admitted to fever and chills for approximately 3 days intermittently.  In the ED oxygen saturations were noted to be 87% on room air.  CXR noted bibasilar interstitial opacities.  Date of Positive COVID Test:  02/23/2020  Vaccination Status: Pfizer x2  COVID-19 specific Treatment: Steroid 2/22 > Remdesivir 2/23 >  Antimicrobials:  Azithromycin 2/22 Rocephin 2/22  DVT prophylaxis: lovenox  Subjective: Afebrile.  Vital signs stable.  Oxygen saturations 88 to 92% on 5 L nasal cannula support.  Alert conversant and pleasant at time of my visit.  States he is feeling better even since the time of his presentation to the ER.  Denies chest pain nausea or vomiting.  Assessment & Plan:  COVID Pneumonia -acute hypoxic respiratory failure Significant infiltrate appreciated on CT chest -clinically the patient is requiring a moderate amount of oxygen but does appear to be stabilizing -Remdesivir added to Solu-Medrol -follow for possible need to add Actemra but not dosing presently as patient does appear to be leveling off -titrate oxygen as necessary  Recent Labs  Lab 02/23/20 1240 02/24/20 1114  DDIMER 1.42*  --   FERRITIN 1,402*  --   CRP  --  16.0*  PROCALCITON 0.14  --      Possible newly diagnosed CHF TTE pending -avoid significant diuresis for now until quantification available -no gross volume overload on peripheral exam  HLD Hold medical therapy until Remdesivir completed and oral intake improved  CAD Asymptomatic presently  BPH Continue usual home medical therapy  Anxiety d/o  Continue usual home medical  therapy  Code Status: FULL CODE Family Communication: No family present at time of exam Status is: Inpatient  Remains inpatient appropriate because:Inpatient level of care appropriate due to severity of illness   Dispo: The patient is from: Home              Anticipated d/c is to: Home              Anticipated d/c date is: 3 days              Patient currently is not medically stable to d/c.   Difficult to place patient No   Consultants:  none  Objective: Blood pressure 134/87, pulse 89, temperature 97.7 F (36.5 C), temperature source Oral, resp. rate (!) 23, height 5\' 10"  (1.778 m), weight 92.5 kg, SpO2 90 %.  Intake/Output Summary (Last 24 hours) at 02/24/2020 1752 Last data filed at 02/24/2020 1611 Gross per 24 hour  Intake 2109.92 ml  Output 1130 ml  Net 979.92 ml   Filed Weights   02/23/20 1136 02/24/20 0304  Weight: 90.7 kg 92.5 kg    Examination: General: Not in extremis Lungs: Coarse crackles throughout all fields with no wheezing Cardiovascular: Regular rate and rhythm without murmur gallop or rub normal S1 and S2 Abdomen: Nontender, nondistended, soft, bowel sounds positive, no rebound, no ascites, no appreciable mass Extremities: No significant cyanosis, clubbing, or edema bilateral lower extremities  CBC: Recent Labs  Lab 02/23/20 1142 02/24/20 0353  WBC 7.4 4.4  NEUTROABS 6.5  --   HGB 14.4 14.0  HCT 42.9 42.1  MCV 83.1  84.0  PLT 242 274   Basic Metabolic Panel: Recent Labs  Lab 02/23/20 1142 02/24/20 0353  NA 137 138  K 3.8 3.9  CL 106 107  CO2 18* 20*  GLUCOSE 124* 145*  BUN 20 21  CREATININE 1.07 0.92  CALCIUM 8.9 8.8*   GFR: Estimated Creatinine Clearance: 67.1 mL/min (by C-G formula based on SCr of 0.92 mg/dL).  Liver Function Tests: No results for input(s): AST, ALT, ALKPHOS, BILITOT, PROT, ALBUMIN in the last 168 hours. No results for input(s): LIPASE, AMYLASE in the last 168 hours. No results for input(s): AMMONIA in the  last 168 hours.  Coagulation Profile: No results for input(s): INR, PROTIME in the last 168 hours.  Cardiac Enzymes: No results for input(s): CKTOTAL, CKMB, CKMBINDEX, TROPONINI in the last 168 hours.  HbA1C: No results found for: HGBA1C  CBG: No results for input(s): GLUCAP in the last 168 hours.  Recent Results (from the past 240 hour(s))  Culture, blood (Routine X 2) w Reflex to ID Panel     Status: None (Preliminary result)   Collection Time: 02/23/20 12:40 PM   Specimen: BLOOD  Result Value Ref Range Status   Specimen Description BLOOD RIGHT [facility]  Final   Special Requests   Final    BOTTLES DRAWN AEROBIC AND ANAEROBIC Blood Culture adequate volume   Culture   Final    NO GROWTH < 24 HOURS Performed at [facility], [address], [place], [place] [address]    Report Status PENDING  Incomplete  Culture, blood (Routine X 2) w Reflex to ID Panel     Status: None (Preliminary result)   Collection Time: 02/23/20 12:40 PM   Specimen: BLOOD  Result Value Ref Range Status   Specimen Description BLOOD LEFT FA  Final   Special Requests   Final    BOTTLES DRAWN AEROBIC AND ANAEROBIC Blood Culture results may not be optimal due to an inadequate volume of blood received in culture bottles   Culture   Final    NO GROWTH < 24 HOURS Performed at [facility], [address] [place], [place] [address]    Report Status PENDING  Incomplete  SARS CORONAVIRUS 2 (TAT 6-24 HRS) Nasopharyngeal Nasopharyngeal Swab     Status: Abnormal   Collection Time: 02/23/20 12:40 PM   Specimen: Nasopharyngeal Swab  Result Value Ref Range Status   SARS Coronavirus 2 POSITIVE (A) NEGATIVE Final    Comment: (NOTE) SARS-CoV-2 target nucleic acids are DETECTED.  The SARS-CoV-2 RNA is generally detectable in upper and lower respiratory specimens during the acute phase of infection. Positive results are indicative of the presence of SARS-CoV-2 RNA. Clinical correlation with patient  history and other diagnostic information is  necessary to determine patient infection status. Positive results do not rule out bacterial infection or co-infection with other viruses.  The expected result is Negative.  Fact Sheet for Patients: HairSlick.no  Fact Sheet for Healthcare Providers: quierodirigir.com  This test is not yet approved or cleared by the [place] FDA and  has been authorized for detection and/or diagnosis of SARS-CoV-2 by FDA under an Emergency Use Authorization (EUA). This EUA will remain  in effect (meaning this test can be used) for the duration of the COVID-19 declaration under Section 564(b)(1) of the Act, 21 U. S.C. section 360bbb-3(b)(1), unless the authorization is terminated or revoked sooner.   Performed at [facility], [address], [place], [place] [address]   Culture, sputum-assessment  Status: None   Collection Time: 02/23/20  5:05 PM   Specimen: Expectorated Sputum  Result Value Ref Range Status   Specimen Description EXPECTORATED SPUTUM  Final   Special Requests NONE  Final   Sputum evaluation   Final    THIS SPECIMEN IS ACCEPTABLE FOR SPUTUM CULTURE Performed at [facility], [address] [place], [place] [address]    Report Status 02/23/2020 FINAL  Final  Culture, Respiratory w Gram Stain     Status: None (Preliminary result)   Collection Time: 02/23/20  5:05 PM  Result Value Ref Range Status   Specimen Description   Final    EXPECTORATED SPUTUM Performed at [facility], [address] [place], [place] [address]    Special Requests   Final    NONE Reflexed from [id number] Performed at [facility], [address] [place], [place] [address]    Gram Stain   Final    FEW WBC PRESENT, PREDOMINANTLY PMN MODERATE SQUAMOUS EPITHELIAL CELLS PRESENT MODERATE GRAM POSITIVE COCCI    Culture   Final    CULTURE REINCUBATED FOR BETTER  GROWTH Performed at [facility], [address] [place], [place] [address]    Report Status PENDING  Incomplete     Scheduled Meds: . vitamin C  500 mg Oral Daily  . aspirin EC  81 mg Oral Daily  . busPIRone  5 mg Oral BID  . enoxaparin (LOVENOX) injection  40 mg Subcutaneous Q24H  . finasteride  5 mg Oral Daily  . guaiFENesin  600 mg Oral BID  . Ipratropium-Albuterol  2 puff Inhalation QID  . losartan  50 mg Oral Daily  . methylPREDNISolone (SOLU-MEDROL) injection  90 mg Intravenous Q12H  . tamsulosin  0.4 mg Oral Daily  . terazosin  2 mg Oral QHS  . zinc sulfate  220 mg Oral Daily   Continuous Infusions: . sodium chloride 10 mL/hr at 02/24/20 1602  . [START ON 02/25/2020] remdesivir 100 mg in NS 100 mL       LOS: 1 day   Lonia Blood, MD [facility] Office  [phone] Pager - Text Page per Amion  If 7PM-7AM, please contact night-coverage per Amion 02/24/2020, 5:52 PM

## 2020-02-24 NOTE — Consult Note (Signed)
Remdesivir - Pharmacy Brief Note   O:  ALT: NNL CT Chest: Findings are most consistent with multifocal pneumonia in the setting of probable COVID-19 infection. SpO2: 92% on 5L [place]   A/P:  Remdesivir 200 mg IVPB once followed by 100 mg IVPB daily x 4 days.   Martyn Malay, [facility] 02/24/2020 11:29 AM

## 2020-02-24 NOTE — Progress Notes (Signed)
Paged dr. Sharon Seller to make aware patients heart rate increased to 150 and sustained for about 5 mins. Patient asymptomatic, no distress per patient. Heart rate did return back down to 80-90

## 2020-02-25 ENCOUNTER — Inpatient Hospital Stay (HOSPITAL_COMMUNITY)
Admit: 2020-02-25 | Discharge: 2020-02-25 | Disposition: A | Discharge status: Home / Self Care | Payer: Medicare HMO | Attending: Internal Medicine | Admitting: Internal Medicine

## 2020-02-25 DIAGNOSIS — U071 COVID-19: Secondary | ICD-10-CM | POA: Diagnosis not present

## 2020-02-25 DIAGNOSIS — J1282 Pneumonia due to coronavirus disease 2019: Secondary | ICD-10-CM | POA: Diagnosis not present

## 2020-02-25 DIAGNOSIS — I5021 Acute systolic (congestive) heart failure: Secondary | ICD-10-CM

## 2020-02-25 DIAGNOSIS — R0902 Hypoxemia: Secondary | ICD-10-CM | POA: Diagnosis not present

## 2020-02-25 LAB — ECHOCARDIOGRAM COMPLETE
Height: 70 in
S' Lateral: 2.42 cm
Weight: 3202.84 oz

## 2020-02-25 LAB — CBC WITH DIFFERENTIAL/PLATELET
Abs Immature Granulocytes: 0.07 10*3/uL (ref 0.00–0.07)
Basophils Absolute: 0 10*3/uL (ref 0.0–0.1)
Basophils Relative: 0 %
Eosinophils Absolute: 0 10*3/uL (ref 0.0–0.5)
Eosinophils Relative: 0 %
HCT: 39.3 % (ref 39.0–52.0)
Hemoglobin: 13.7 g/dL (ref 13.0–17.0)
Immature Granulocytes: 1 %
Lymphocytes Relative: 4 %
Lymphs Abs: 0.4 10*3/uL — ABNORMAL LOW (ref 0.7–4.0)
MCH: 28.8 pg (ref 26.0–34.0)
MCHC: 34.9 g/dL (ref 30.0–36.0)
MCV: 82.7 fL (ref 80.0–100.0)
Monocytes Absolute: 0.3 10*3/uL (ref 0.1–1.0)
Monocytes Relative: 3 %
Neutro Abs: 10.4 10*3/uL — ABNORMAL HIGH (ref 1.7–7.7)
Neutrophils Relative %: 92 %
Platelets: 325 10*3/uL (ref 150–400)
RBC: 4.75 MIL/uL (ref 4.22–5.81)
RDW: 14 % (ref 11.5–15.5)
WBC: 11.2 10*3/uL — ABNORMAL HIGH (ref 4.0–10.5)
nRBC: 0 % (ref 0.0–0.2)

## 2020-02-25 LAB — COMPREHENSIVE METABOLIC PANEL
ALT: 20 U/L (ref 0–44)
AST: 21 U/L (ref 15–41)
Albumin: 2.7 g/dL — ABNORMAL LOW (ref 3.5–5.0)
Alkaline Phosphatase: 81 U/L (ref 38–126)
Anion gap: 7 (ref 5–15)
BUN: 30 mg/dL — ABNORMAL HIGH (ref 8–23)
CO2: 23 mmol/L (ref 22–32)
Calcium: 8.6 mg/dL — ABNORMAL LOW (ref 8.9–10.3)
Chloride: 109 mmol/L (ref 98–111)
Creatinine, Ser: 0.88 mg/dL (ref 0.61–1.24)
GFR, Estimated: >60 mL/min (ref >60)
Glucose, Bld: 149 mg/dL — ABNORMAL HIGH (ref 70–99)
Potassium: 3.9 mmol/L (ref 3.5–5.1)
Sodium: 139 mmol/L (ref 135–145)
Total Bilirubin: 0.8 mg/dL (ref 0.3–1.2)
Total Protein: 5.7 g/dL — ABNORMAL LOW (ref 6.5–8.1)

## 2020-02-25 LAB — D-DIMER, QUANTITATIVE: D-Dimer, Quant: 1.34 ug/mL-FEU — ABNORMAL HIGH (ref 0.00–0.50)

## 2020-02-25 LAB — C-REACTIVE PROTEIN: CRP: 9.8 mg/dL — ABNORMAL HIGH (ref <1.0)

## 2020-02-25 LAB — FERRITIN: Ferritin: 1446 ng/mL — ABNORMAL HIGH (ref 24–336)

## 2020-02-25 MED ORDER — METOPROLOL TARTRATE 25 MG PO TABS
12.5000 mg | ORAL_TABLET | Freq: Two times a day (BID) | ORAL | Status: DC
  Administered 2020-02-25 – 2020-02-28 (×7): 12.5 mg via ORAL
  Filled 2020-02-25 (×7): qty 1

## 2020-02-25 NOTE — Progress Notes (Signed)
Timothy Giles  MWU:[id number] DOB: 09/30/1932 DOA: 02/23/2020 PCP: Jerl Mina, MD    Brief Narrative:  87 year old with a history of asthma, depression, HTN, and CAD status post PCI with stent who presented to the ER with acute onset of worsening shortness of breath and cough.  His wife had recently been hospitalized for Covid pneumonia.  Patient admitted to fever and chills for approximately 3 days intermittently.  In the ED oxygen saturations were noted to be 87% on room air.  CXR noted bibasilar interstitial opacities.  Date of Positive COVID Test:  02/23/2020  Vaccination Status: Pfizer x2  COVID-19 specific Treatment: Steroid 2/22 > Remdesivir 2/23 >  Antimicrobials:  Azithromycin 2/22 Rocephin 2/22  DVT prophylaxis: lovenox  Subjective: Afebrile.  Vital signs stable.  Oxygen saturation holding at 91-95% on conventional nasal cannula at 4L. Denies chest pain nausea vomiting or other new complaints. States he is feeling better overall.  Assessment & Plan:  COVID Pneumonia -acute hypoxic respiratory failure Significant infiltrate appreciated on CT chest -clinically the patient is requiring a moderate amount of oxygen but does appear to be stabilizing -Remdesivir added to Solu-Medrol -follow for possible need to add Actemra  -titrate oxygen as necessary -appears to be holding steady at this time  Recent Labs  Lab 02/23/20 1240 02/24/20 1114 02/25/20 0346  DDIMER 1.42*  --  1.34*  FERRITIN 1,402*  --  1,446*  CRP  --  16.0*  --   ALT  --   --  20  PROCALCITON 0.14  --   --      Possible newly diagnosed CHF - ruled out TTE notes normal systolic LV fxn   HLD Hold medical therapy until Remdesivir completed and oral intake improved  CAD Asymptomatic presently  BPH Continue usual home medical therapy  Anxiety d/o  Continue usual home medical therapy  Code Status: FULL CODE Family Communication: No family present at time of exam Status is:  Inpatient  Remains inpatient appropriate because:Inpatient level of care appropriate due to severity of illness   Dispo: The patient is from: Home              Anticipated d/c is to: Home              Anticipated d/c date is: 3 days              Patient currently is not medically stable to d/c.   Difficult to place patient No   Consultants:  none  Objective: Blood pressure 129/76, pulse 64, temperature 98.6 F (37 C), resp. rate 20, height 5\' 10"  (1.778 m), weight 90.8 kg, SpO2 95 %.  Intake/Output Summary (Last 24 hours) at 02/25/2020 0921 Last data filed at 02/24/2020 2030 Gross per 24 hour  Intake 1169.92 ml  Output 400 ml  Net 769.92 ml   Filed Weights   02/23/20 1136 02/24/20 0304 02/25/20 0609  Weight: 90.7 kg 92.5 kg 90.8 kg    Examination: General: Not in extremis Lungs: Coarse crackles throughout -no wheezing Cardiovascular: RRR without murmur or rub Abdomen: NT/ND, soft, no rebound, BS positive Extremities: No significant C/C/E bilateral lower extremities  CBC: Recent Labs  Lab 02/23/20 1142 02/24/20 0353 02/25/20 0346  WBC 7.4 4.4 11.2*  NEUTROABS 6.5  --  10.4*  HGB 14.4 14.0 13.7  HCT 42.9 42.1 39.3  MCV 83.1 84.0 82.7  PLT 242 274 325   Basic Metabolic Panel: Recent Labs  Lab 02/23/20 1142 02/24/20 0353 02/25/20 0346  NA  137 138 139  K 3.8 3.9 3.9  CL 106 107 109  CO2 18* 20* 23  GLUCOSE 124* 145* 149*  BUN 20 21 30*  CREATININE 1.07 0.92 0.88  CALCIUM 8.9 8.8* 8.6*   GFR: Estimated Creatinine Clearance: 69.5 mL/min (by C-G formula based on SCr of 0.88 mg/dL).  Liver Function Tests: Recent Labs  Lab 02/25/20 0346  AST 21  ALT 20  ALKPHOS 81  BILITOT 0.8  PROT 5.7*  ALBUMIN 2.7*     Recent Results (from the past 240 hour(s))  Culture, blood (Routine X 2) w Reflex to ID Panel     Status: None (Preliminary result)   Collection Time: 02/23/20 12:40 PM   Specimen: BLOOD  Result Value Ref Range Status   Specimen Description  BLOOD RIGHT [place]  Final   Special Requests   Final    BOTTLES DRAWN AEROBIC AND ANAEROBIC Blood Culture adequate volume   Culture   Final    NO GROWTH 2 DAYS Performed at [facility], [address], [place], [place] [address]    Report Status PENDING  Incomplete  Culture, blood (Routine X 2) w Reflex to ID Panel     Status: None (Preliminary result)   Collection Time: 02/23/20 12:40 PM   Specimen: BLOOD  Result Value Ref Range Status   Specimen Description BLOOD LEFT FA  Final   Special Requests   Final    BOTTLES DRAWN AEROBIC AND ANAEROBIC Blood Culture results may not be optimal due to an inadequate volume of blood received in culture bottles   Culture   Final    NO GROWTH 2 DAYS Performed at [facility], [address] [place], [place] [address]    Report Status PENDING  Incomplete  SARS CORONAVIRUS 2 (TAT 6-24 HRS) Nasopharyngeal Nasopharyngeal Swab     Status: Abnormal   Collection Time: 02/23/20 12:40 PM   Specimen: Nasopharyngeal Swab  Result Value Ref Range Status   SARS Coronavirus 2 POSITIVE (A) NEGATIVE Final    Comment: (NOTE) SARS-CoV-2 target nucleic acids are DETECTED.  The SARS-CoV-2 RNA is generally detectable in upper and lower respiratory specimens during the acute phase of infection. Positive results are indicative of the presence of SARS-CoV-2 RNA. Clinical correlation with patient history and other diagnostic information is  necessary to determine patient infection status. Positive results do not rule out bacterial infection or co-infection with other viruses.  The expected result is Negative.  Fact Sheet for Patients: HairSlick.no  Fact Sheet for Healthcare Providers: quierodirigir.com  This test is not yet approved or cleared by the [place] FDA and  has been authorized for detection and/or diagnosis of SARS-CoV-2 by FDA under an Emergency Use Authorization (EUA). This  EUA will remain  in effect (meaning this test can be used) for the duration of the COVID-19 declaration under Section 564(b)(1) of the Act, 21 U. S.C. section 360bbb-3(b)(1), unless the authorization is terminated or revoked sooner.   Performed at [facility], [address], [place], [place] [address]   Culture, sputum-assessment     Status: None   Collection Time: 02/23/20  5:05 PM   Specimen: Expectorated Sputum  Result Value Ref Range Status   Specimen Description EXPECTORATED SPUTUM  Final   Special Requests NONE  Final   Sputum evaluation   Final    THIS SPECIMEN IS ACCEPTABLE FOR SPUTUM CULTURE Performed at [facility], [address] [place], [place] [address]    Report  Status 02/23/2020 FINAL  Final  Culture, Respiratory w Gram Stain     Status: None (Preliminary result)   Collection Time: 02/23/20  5:05 PM  Result Value Ref Range Status   Specimen Description   Final    EXPECTORATED SPUTUM Performed at [facility], [address] [place], [place] [address]    Special Requests   Final    NONE Reflexed from [id number] Performed at [facility], [address] [place], [place] [address]    Gram Stain   Final    FEW WBC PRESENT, PREDOMINANTLY PMN MODERATE SQUAMOUS EPITHELIAL CELLS PRESENT MODERATE GRAM POSITIVE COCCI    Culture   Final    CULTURE REINCUBATED FOR BETTER GROWTH Performed at [facility], [address] [place], [place] [address]    Report Status PENDING  Incomplete     Scheduled Meds:  vitamin C  500 mg Oral Daily   aspirin EC  81 mg Oral Daily   busPIRone  5 mg Oral BID   enoxaparin (LOVENOX) injection  40 mg Subcutaneous Q24H   finasteride  5 mg Oral Daily   guaiFENesin  600 mg Oral BID   Ipratropium-Albuterol  2 puff Inhalation QID   losartan  50 mg Oral Daily   methylPREDNISolone (SOLU-MEDROL) injection  90 mg Intravenous Q12H   tamsulosin  0.4 mg Oral Daily   terazosin  2 mg Oral  QHS   zinc sulfate  220 mg Oral Daily   Continuous Infusions:  sodium chloride 10 mL/hr at 02/25/20 0840   remdesivir 100 mg in NS 100 mL 100 mg (02/25/20 0840)     LOS: 2 days   Lonia Blood, MD [facility] Office  [phone] Pager - Text Page per Loretha Stapler  If 7PM-7AM, please contact night-coverage per Amion 02/25/2020, 9:21 AM

## 2020-02-25 NOTE — Progress Notes (Addendum)
CCMD called to report heart rate sustaining 160-170 for 2 minutes. Highest 172. Notified MD via secure chat.   MD to add BB per secure chat message.

## 2020-02-25 NOTE — Progress Notes (Signed)
*  PRELIMINARY RESULTS* Echocardiogram 2D Echocardiogram has been performed.  Cristela Blue 02/25/2020, 9:39 AM

## 2020-02-25 NOTE — Progress Notes (Signed)
CCMD called to report heart rate at 169. Was not sustaining. Patient asymptomatic. Will continue to monitor and notify MD if sustaining.

## 2020-02-25 NOTE — Progress Notes (Signed)
   02/25/20 1708  Assess: MEWS Score  Temp 98.7 F (37.1 C)  BP 103/80  Pulse Rate 80  ECG Heart Rate 87  Resp (!) 27  SpO2 91 %  Assess: MEWS Score  MEWS Temp 0  MEWS Systolic 0  MEWS Pulse 0  MEWS RR 2  MEWS LOC 0  MEWS Score 2  MEWS Score Color Yellow  Assess: if the MEWS score is Yellow or Red  Were vital signs taken at a resting state? Yes  Focused Assessment Change from prior assessment (see assessment flowsheet)  Early Detection of Sepsis Score *See Row Information* Low  MEWS guidelines implemented *See Row Information* No, vital signs rechecked  Escalate  MEWS: Escalate Yellow: discuss with charge nurse/RN and consider discussing with provider and RRT  Notify: Charge Nurse/RN  Name of Charge Nurse/RN Notified Blima Rich  Date Charge Nurse/RN Notified 02/25/20  Time Charge Nurse/RN Notified 1733  Document  Patient Outcome Other (Comment) (continue to monitor)  Progress note created (see row info) Yes

## 2020-02-26 DIAGNOSIS — R0902 Hypoxemia: Secondary | ICD-10-CM | POA: Diagnosis not present

## 2020-02-26 DIAGNOSIS — U071 COVID-19: Secondary | ICD-10-CM | POA: Diagnosis not present

## 2020-02-26 DIAGNOSIS — J1282 Pneumonia due to coronavirus disease 2019: Secondary | ICD-10-CM | POA: Diagnosis not present

## 2020-02-26 LAB — COMPREHENSIVE METABOLIC PANEL
ALT: 21 U/L (ref 0–44)
AST: 19 U/L (ref 15–41)
Albumin: 2.8 g/dL — ABNORMAL LOW (ref 3.5–5.0)
Alkaline Phosphatase: 74 U/L (ref 38–126)
Anion gap: 6 (ref 5–15)
BUN: 39 mg/dL — ABNORMAL HIGH (ref 8–23)
CO2: 24 mmol/L (ref 22–32)
Calcium: 8.7 mg/dL — ABNORMAL LOW (ref 8.9–10.3)
Chloride: 111 mmol/L (ref 98–111)
Creatinine, Ser: 0.95 mg/dL (ref 0.61–1.24)
GFR, Estimated: >60 mL/min (ref >60)
Glucose, Bld: 159 mg/dL — ABNORMAL HIGH (ref 70–99)
Potassium: 4.2 mmol/L (ref 3.5–5.1)
Sodium: 141 mmol/L (ref 135–145)
Total Bilirubin: 0.6 mg/dL (ref 0.3–1.2)
Total Protein: 5.7 g/dL — ABNORMAL LOW (ref 6.5–8.1)

## 2020-02-26 LAB — CBC WITH DIFFERENTIAL/PLATELET
Abs Immature Granulocytes: 0.13 10*3/uL — ABNORMAL HIGH (ref 0.00–0.07)
Basophils Absolute: 0 10*3/uL (ref 0.0–0.1)
Basophils Relative: 0 %
Eosinophils Absolute: 0 10*3/uL (ref 0.0–0.5)
Eosinophils Relative: 0 %
HCT: 42.2 % (ref 39.0–52.0)
Hemoglobin: 14.3 g/dL (ref 13.0–17.0)
Immature Granulocytes: 1 %
Lymphocytes Relative: 4 %
Lymphs Abs: 0.4 10*3/uL — ABNORMAL LOW (ref 0.7–4.0)
MCH: 28 pg (ref 26.0–34.0)
MCHC: 33.9 g/dL (ref 30.0–36.0)
MCV: 82.7 fL (ref 80.0–100.0)
Monocytes Absolute: 0.4 10*3/uL (ref 0.1–1.0)
Monocytes Relative: 4 %
Neutro Abs: 9.5 10*3/uL — ABNORMAL HIGH (ref 1.7–7.7)
Neutrophils Relative %: 91 %
Platelets: 330 10*3/uL (ref 150–400)
RBC: 5.1 MIL/uL (ref 4.22–5.81)
RDW: 14.1 % (ref 11.5–15.5)
WBC: 10.4 10*3/uL (ref 4.0–10.5)
nRBC: 0 % (ref 0.0–0.2)

## 2020-02-26 LAB — C-REACTIVE PROTEIN: CRP: 4.5 mg/dL — ABNORMAL HIGH (ref <1.0)

## 2020-02-26 LAB — CULTURE, RESPIRATORY W GRAM STAIN: Culture: NORMAL

## 2020-02-26 LAB — FERRITIN: Ferritin: 822 ng/mL — ABNORMAL HIGH (ref 24–336)

## 2020-02-26 LAB — D-DIMER, QUANTITATIVE: D-Dimer, Quant: 1.59 ug/mL-FEU — ABNORMAL HIGH (ref 0.00–0.50)

## 2020-02-26 MED ORDER — DIPHENHYDRAMINE HCL 25 MG PO CAPS
25.0000 mg | ORAL_CAPSULE | Freq: Every day | ORAL | Status: DC
  Administered 2020-02-26 – 2020-02-27 (×2): 25 mg via ORAL
  Filled 2020-02-26 (×2): qty 1

## 2020-02-26 NOTE — Progress Notes (Signed)
Timothy Giles  XNA:[id number] DOB: 1932-12-22 DOA: 02/23/2020 PCP: Jerl Mina, MD    Brief Narrative:  [phone] with a history of asthma, depression, HTN, and CAD status post PCI with stent who presented to the ER with acute onset of worsening shortness of breath and cough.  His wife had recently been hospitalized for Covid pneumonia.  Patient admitted to fever and chills for approximately 3 days intermittently.  In the ED oxygen saturations were noted to be 87% on room air.  CXR noted bibasilar interstitial opacities.  Date of Positive COVID Test:  02/23/2020  Vaccination Status: Pfizer x2  COVID-19 specific Treatment: Steroid 2/22 > Remdesivir 2/23 >  Antimicrobials:  Azithromycin 2/22 Rocephin 2/22  DVT prophylaxis: lovenox  Subjective: Afebrile.  Vital signs stable.  Respiratory rate calm/normal.  Saturations 90-92% on 4 L nasal cannula.  Inflammatory markers beginning to trend downward. Is anxious to go home asap. Denies cp, n/v, or abdom pain. Has not yet ambulated to a signif extent due to his isolation.   Assessment & Plan:  COVID Pneumonia -acute hypoxic respiratory failure Significant infiltrate appreciated on CT chest - clinically the patient is requiring a moderate amount of oxygen but does appear to be stabilizing further each day - Remdesivir + Solu-Medrol - does not appear he will need Actemra - titrate oxygen as able - mobilize  Recent Labs  Lab 02/23/20 1240 02/24/20 1114 02/25/20 0346 02/26/20 0647  DDIMER 1.42*  --  1.34* 1.59*  FERRITIN 1,402*  --  1,446* 822*  CRP  --  16.0* 9.8* 4.5*  ALT  --   --  20 21  PROCALCITON 0.14  --   --   --      Possible newly diagnosed CHF - ruled out TTE notes normal systolic LV fxn   Intermittent tachycardia EKG reveals evidence of frequent PACs which in the setting of his acute physiologic distress are likely the etiology of his frequent intermittent tachycardic spells - he remains stable otherwise at present -  low-dose beta-blocker initiated  HLD Hold medical therapy until Remdesivir completed and oral intake improved  CAD Asymptomatic presently  BPH Continue usual home medical therapy  Anxiety d/o  Continue usual home medical therapy  Code Status: FULL CODE Family Communication: No family present at time of exam Status is: Inpatient  Remains inpatient appropriate because:Inpatient level of care appropriate due to severity of illness   Dispo: The patient is from: Home              Anticipated d/c is to: Home              Anticipated d/c date is: 3 days              Patient currently is not medically stable to d/c.   Difficult to place patient No   Consultants:  none  Objective: Blood pressure 128/76, pulse 86, temperature 98.3 F (36.8 C), temperature source Oral, resp. rate 17, height 5\' 10"  (1.778 m), weight 92.2 kg, SpO2 92 %.  Intake/Output Summary (Last 24 hours) at 02/26/2020 1025 Last data filed at 02/25/2020 2200 Gross per 24 hour  Intake 24.89 ml  Output 600 ml  Net -575.11 ml   Filed Weights   02/24/20 0304 02/25/20 0609 02/26/20 0338  Weight: 92.5 kg 90.8 kg 92.2 kg    Examination: General: Not in extremis - alert and conversant Lungs: Coarse crackles throughout w/ no wheezing  Cardiovascular: RRR - intermittent ectopic beats  Abdomen: NT/ND, soft,  no rebound, BS positive Extremities: No significant C/C/E bilateral LE  CBC: Recent Labs  Lab 02/23/20 1142 02/24/20 0353 02/25/20 0346 02/26/20 0647  WBC 7.4 4.4 11.2* 10.4  NEUTROABS 6.5  --  10.4* 9.5*  HGB 14.4 14.0 13.7 14.3  HCT 42.9 42.1 39.3 42.2  MCV 83.1 84.0 82.7 82.7  PLT 242 274 325 330   Basic Metabolic Panel: Recent Labs  Lab 02/24/20 0353 02/25/20 0346 02/26/20 0647  NA 138 139 141  K 3.9 3.9 4.2  CL 107 109 111  CO2 20* 23 24  GLUCOSE 145* 149* 159*  BUN 21 30* 39*  CREATININE 0.92 0.88 0.95  CALCIUM 8.8* 8.6* 8.7*   GFR: Estimated Creatinine Clearance: 64.9 mL/min (by  C-G formula based on SCr of 0.95 mg/dL).  Liver Function Tests: Recent Labs  Lab 02/25/20 0346 02/26/20 0647  AST 21 19  ALT 20 21  ALKPHOS 81 74  BILITOT 0.8 0.6  PROT 5.7* 5.7*  ALBUMIN 2.7* 2.8*     Recent Results (from the past 240 hour(s))  Culture, blood (Routine X 2) w Reflex to ID Panel     Status: None (Preliminary result)   Collection Time: 02/23/20 12:40 PM   Specimen: BLOOD  Result Value Ref Range Status   Specimen Description BLOOD RIGHT [facility]  Final   Special Requests   Final    BOTTLES DRAWN AEROBIC AND ANAEROBIC Blood Culture adequate volume   Culture   Final    NO GROWTH 3 DAYS Performed at [facility], [address] [place], [place] [address]    Report Status PENDING  Incomplete  Culture, blood (Routine X 2) w Reflex to ID Panel     Status: None (Preliminary result)   Collection Time: 02/23/20 12:40 PM   Specimen: BLOOD  Result Value Ref Range Status   Specimen Description BLOOD LEFT FA  Final   Special Requests   Final    BOTTLES DRAWN AEROBIC AND ANAEROBIC Blood Culture results may not be optimal due to an inadequate volume of blood received in culture bottles   Culture   Final    NO GROWTH 3 DAYS Performed at [facility], [address] [place], [place] [address]    Report Status PENDING  Incomplete  SARS CORONAVIRUS 2 (TAT 6-24 HRS) Nasopharyngeal Nasopharyngeal Swab     Status: Abnormal   Collection Time: 02/23/20 12:40 PM   Specimen: Nasopharyngeal Swab  Result Value Ref Range Status   SARS Coronavirus 2 POSITIVE (A) NEGATIVE Final    Comment: (NOTE) SARS-CoV-2 target nucleic acids are DETECTED.  The SARS-CoV-2 RNA is generally detectable in upper and lower respiratory specimens during the acute phase of infection. Positive results are indicative of the presence of SARS-CoV-2 RNA. Clinical correlation with patient history and other diagnostic information is  necessary to determine patient infection status. Positive  results do not rule out bacterial infection or co-infection with other viruses.  The expected result is Negative.  Fact Sheet for Patients: HairSlick.no  Fact Sheet for Healthcare Providers: quierodirigir.com  This test is not yet approved or cleared by the [place] FDA and  has been authorized for detection and/or diagnosis of SARS-CoV-2 by FDA under an Emergency Use Authorization (EUA). This EUA will remain  in effect (meaning this test can be used) for the duration of the COVID-19 declaration under Section 564(b)(1) of the Act, 21 U. S.C. section 360bbb-3(b)(1), unless the authorization is terminated or revoked sooner.   Performed at [facility], [address] [place], [place] [address]   Culture, sputum-assessment     Status: None   Collection Time: 02/23/20  5:05 PM   Specimen: Expectorated Sputum  Result Value Ref Range Status   Specimen Description EXPECTORATED SPUTUM  Final   Special Requests NONE  Final   Sputum evaluation   Final    THIS SPECIMEN IS ACCEPTABLE FOR SPUTUM CULTURE Performed at [facility], [address] [place], [place] [address]    Report Status 02/23/2020 FINAL  Final  Culture, Respiratory w Gram Stain     Status: None   Collection Time: 02/23/20  5:05 PM  Result Value Ref Range Status   Specimen Description   Final    EXPECTORATED SPUTUM Performed at [facility], [address] [place], [place] [address]    Special Requests   Final    NONE Reflexed from [id number] Performed at [facility], [address] [place], [place] [address]    Gram Stain   Final    FEW WBC PRESENT, PREDOMINANTLY PMN MODERATE SQUAMOUS EPITHELIAL CELLS PRESENT MODERATE GRAM POSITIVE COCCI    Culture   Final    ABUNDANT Normal respiratory flora-no Staph aureus or Pseudomonas seen Performed at [facility], [address], [place], [place] [address]    Report  Status 02/26/2020 FINAL  Final     Scheduled Meds: . vitamin C  500 mg Oral Daily  . aspirin EC  81 mg Oral Daily  . busPIRone  5 mg Oral BID  . enoxaparin (LOVENOX) injection  40 mg Subcutaneous Q24H  . finasteride  5 mg Oral Daily  . guaiFENesin  600 mg Oral BID  . Ipratropium-Albuterol  2 puff Inhalation QID  . losartan  50 mg Oral Daily  . methylPREDNISolone (SOLU-MEDROL) injection  90 mg Intravenous Q12H  . metoprolol tartrate  12.5 mg Oral BID  . tamsulosin  0.4 mg Oral Daily  . terazosin  2 mg Oral QHS  . zinc sulfate  220 mg Oral Daily   Continuous Infusions: . sodium chloride 10 mL/hr at 02/26/20 0903  . remdesivir 100 mg in NS 100 mL 100 mg (02/26/20 0904)     LOS: 3 days   Lonia Blood, MD [facility] Office  [phone] Pager - Text Page per Amion  If 7PM-7AM, please contact night-coverage per Amion 02/26/2020, 10:25 AM

## 2020-02-26 NOTE — Care Management Important Message (Signed)
Important Message  Patient Details  Name: Timothy Giles MRN: [id number] Date of Birth: 14-Aug-1932   Medicare Important Message Given:  N/A - LOS <3 / Initial given by admissions  Initial Medicare IM reviewed with patient's spouse, Abhi Moccia, by Jennye Moccasin, Patient Access Associate on 02/25/2020 at 10:21am.    Johnell Comings 02/26/2020, 8:19 AM

## 2020-02-26 NOTE — Evaluation (Signed)
Physical Therapy Evaluation Patient Details Name: Timothy Giles MRN: [id number] DOB: 11-06-1932 Today's Date: 02/26/2020   History of Present Illness  Pt is an 87 y/o M admitted on 02/23/20 with c/c of SOB. Pt being treated for covid pneumonia & acute hypoxic respiratory failure. PMH: asthma, depression, HTN, CAD s/p PCI & stent placement, AAA without rupture, hypercholesterolemia  Clinical Impression  Pt seen for PT evaluation with pt agreeable. Pt is able to ambulate short distances in room (limited by multiple lines, IV, O2) with RW & min assist. Pt does desaturate without c/o SOB & recovers quickly with seated rest break provided. PT educated pt on purpose & regimen of using incentive spirometer & acapella flutter valve. Will continue to see pt acutely to progress gait distances, address balance & activity tolerance, & reduce fall risk. Pt would benefit from HHPT f/u upon/ d/c.     Follow Up Recommendations Home health PT;Supervision for mobility/OOB    Equipment Recommendations  Rolling walker with 5" wheels    Recommendations for Other Services       Precautions / Restrictions Precautions Precautions: Fall Restrictions Weight Bearing Restrictions: No      Mobility  Bed Mobility               General bed mobility comments: not observed as pt received & left sitting up in recliner    Transfers Overall transfer level: Needs assistance   Transfers: Sit to/from Stand Sit to Stand: Min guard         General transfer comment: cuing for safe hand placement when utilizing RW  Ambulation/Gait Ambulation/Gait assistance: Min assist Gait Distance (Feet):  (4 ft forwards & backwards for a total of ~30 ft (limited 2/2 IV & multiple lines)) Assistive device: Rolling walker (2 wheeled) Gait Pattern/deviations: Decreased step length - left;Decreased step length - right;Decreased stride length     General Gait Details: balance more impaired during retrograde gait with  RW  Stairs            Wheelchair Mobility    Modified Rankin (Stroke Patients Only)       Balance Overall balance assessment: Needs assistance         Standing balance support: Bilateral upper extremity supported;During functional activity Standing balance-Leahy Scale: Fair Standing balance comment: UE support on RW during gait                             Pertinent Vitals/Pain Pain Assessment: No/denies pain    Home Living Family/patient expects to be discharged to:: Private residence Living Arrangements: Spouse/significant other Available Help at Discharge: Family;Available 24 hours/day Type of Home: House Home Access: Level entry (single curb step at sidewalk)     Home Layout: One level Home Equipment: None      Prior Function Level of Independence: Independent         Comments: without AD, pt reports he & wife try to stay active & walk daily, still driving     Hand Dominance        Extremity/Trunk Assessment   Upper Extremity Assessment Upper Extremity Assessment: Overall WFL for tasks assessed    Lower Extremity Assessment Lower Extremity Assessment: Generalized weakness       Communication   Communication: HOH (R ear base, 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 HR up to 108 bpm with activity.    Exercises      Assessment/Plan    PT Assessment Patient needs continued PT services  PT Problem List Decreased strength;Decreased balance;Decreased knowledge of precautions;Decreased mobility;Cardiopulmonary status limiting activity;Decreased activity tolerance       PT Treatment Interventions DME instruction;Functional mobility training;Balance training;Patient/family education;Neuromuscular re-education;Therapeutic activities;Gait training;Stair training;Manual techniques;Cognitive remediation;Therapeutic exercise    PT Goals (Current goals can be found in the Care Plan section)  Acute Rehab PT Goals Patient Stated Goal: get better PT Goal Formulation: With patient Time For Goal Achievement: 03/11/20 Potential to Achieve Goals: Good    Frequency Min 2X/week   Barriers to discharge        Co-evaluation               AM-PAC PT "6 Clicks" Mobility  Outcome Measure Help needed turning from your back to your side while in a flat bed without using bedrails?: None Help needed moving from lying on your back to sitting on the side of a flat bed without using bedrails?: None Help needed moving to and from a bed to a chair (including a wheelchair)?: A Little Help needed standing up from a chair using your arms (e.g., wheelchair or bedside chair)?: A Little Help needed to walk in hospital room?: A Little Help needed climbing 3-5 steps with a railing? : A Lot 6 Click Score: 19    End of Session Equipment Utilized During Treatment: Gait belt;Oxygen Activity Tolerance: Patient tolerated treatment well Patient left: in chair;with chair alarm set;with call bell/phone within reach Nurse Communication: Mobility status (HR & O2) PT Visit Diagnosis: Unsteadiness on feet (R26.81);Muscle weakness (generalized) (M62.81)    Time: 5009-3818 PT Time Calculation (min) (ACUTE ONLY): 16 min   Charges:   PT Evaluation $PT Eval Low Complexity: 1 Low          Aleda Grana, PT, DPT 02/26/20,  11:04 AM   Sandi Mariscal 02/26/2020, 11:02 AM

## 2020-02-26 NOTE — Plan of Care (Signed)
  Problem: Education: Goal: Knowledge of risk factors and measures for prevention of condition will improve Outcome: Progressing   Problem: Coping: Goal: Psychosocial and spiritual needs will be supported Outcome: Progressing   Problem: Respiratory: Goal: Will maintain a patent airway Outcome: Progressing Goal: Complications related to the disease process, condition or treatment will be avoided or minimized Outcome: Progressing   Problem: Education: Goal: Knowledge of risk factors and measures for prevention of condition will improve Outcome: Progressing   Problem: Coping: Goal: Psychosocial and spiritual needs will be supported Outcome: Progressing   Problem: Education: Goal: Knowledge of General Education information will improve Description: Including pain rating scale, medication(s)/side effects and non-pharmacologic comfort measures Outcome: Progressing   Problem: Activity: Goal: Risk for activity intolerance will decrease Outcome: Progressing

## 2020-02-26 NOTE — Evaluation (Signed)
Occupational Therapy Evaluation Patient Details Name: Timothy Giles MRN: [id number] DOB: 13-Feb-1932 Today's Date: 02/26/2020    History of Present Illness Pt is an 87 y/o M admitted on 02/23/20 with c/c of SOB. Pt being treated for covid pneumonia & acute hypoxic respiratory failure. PMH: asthma, depression, HTN, CAD s/p PCI & stent placement, AAA without rupture, hypercholesterolemia   Clinical Impression   Patient presenting with decreased I in self care, balance, functional mobility/transfers, endurance, and safety awareness.  Patient reports living in an apartment with wife and is independently with self care and mobility PTA. Pt reports being very active, he shares IADL tasks with wife, and he drives. Patient currently functioning at min A without use of AD and on 5L O2 via [place]. Pt's O2 saturation remained above 88% this session.  Patient will benefit from acute OT to increase overall independence in the areas of ADLs, functional mobility, and safety awareness in order to safely discharge home with family    Follow Up Recommendations  Home health OT;Supervision - Intermittent    Equipment Recommendations  Other (comment) (RW)       Precautions / Restrictions Precautions Precautions: Fall Restrictions Weight Bearing Restrictions: No      Mobility Bed Mobility Overal bed mobility: Needs Assistance Bed Mobility: Supine to Sit     Supine to sit: Supervision;HOB elevated     General bed mobility comments: min cuing for technique    Transfers Overall transfer level: Needs assistance Equipment used: None Transfers: Sit to/from [employer] Sit to Stand: Min assist Stand pivot transfers: Min assist       General transfer comment: cuing for safe hand placement when utilizing RW    Balance Overall balance assessment: Needs assistance Sitting-balance support: Feet supported Sitting balance-Leahy Scale: Good     Standing balance support: Bilateral upper  extremity supported;During functional activity Standing balance-Leahy Scale: Fair Standing balance comment: UE support on RW during gait                           ADL either performed or assessed with clinical judgement   ADL Overall ADL's : Needs assistance/impaired Eating/Feeding: Modified independent   Grooming: Wash/dry hands;Wash/dry face;Set up;Sitting               Lower Body Dressing: Minimal assistance;Sit to/from stand   Toilet Transfer: Minimal Secondary school teacher Details (indicate cue type and reason): simulated                 Vision Patient Visual Report: No change from baseline              Pertinent Vitals/Pain Pain Assessment: No/denies pain     Hand Dominance Right   Extremity/Trunk Assessment Upper Extremity Assessment Upper Extremity Assessment: Overall WFL for tasks assessed   Lower Extremity Assessment Lower Extremity Assessment: Generalized weakness       Communication Communication Communication: HOH   Cognition Arousal/Alertness: Awake/alert Behavior During Therapy: WFL for tasks assessed/performed Overall Cognitive Status: Within Functional Limits for tasks assessed                                 General Comments: pleasant A & O x4   General Comments  Pt on 5L/min supplemental O2 via nasal cannula during session, SpO2 90% at rest, dropped to 81% with activity with pt recovering within 1 minute with PT educating pt  on pursed lip breathing. HR up to 108 bpm with activity.            Home Living Family/patient expects to be discharged to:: Private residence Living Arrangements: Spouse/significant other Available Help at Discharge: Family;Available 24 hours/day Type of Home: House Home Access: Level entry     Home Layout: One level     Bathroom Shower/Tub: Tub/shower unit         Home Equipment: None          Prior Functioning/Environment Level of  Independence: Independent        Comments: without AD, pt reports he & wife try to stay active & walk daily, still driving. they perform IADL tasks together        OT Problem List: Decreased strength;Impaired balance (sitting and/or standing);Decreased safety awareness;Cardiopulmonary status limiting activity;Decreased activity tolerance;Decreased knowledge of use of DME or AE      OT Treatment/Interventions: Self-care/ADL training;Therapeutic exercise;Energy conservation;Patient/family education;DME and/or AE instruction;Balance training    OT Goals(Current goals can be found in the care plan section) Acute Rehab OT Goals Patient Stated Goal: get better OT Goal Formulation: With patient Time For Goal Achievement: 03/11/20 Potential to Achieve Goals: Good  OT Frequency: Min 2X/week   Barriers to D/C:    none known at this time          AM-PAC OT "6 Clicks" Daily Activity     Outcome Measure Help from another person eating meals?: None Help from another person taking care of personal grooming?: A Little Help from another person toileting, which includes using toliet, bedpan, or urinal?: A Little Help from another person bathing (including washing, rinsing, drying)?: A Little Help from another person to put on and taking off regular upper body clothing?: A Little Help from another person to put on and taking off regular lower body clothing?: A Little 6 Click Score: 19   End of Session Equipment Utilized During Treatment: Oxygen (5Ls) Nurse Communication: Mobility status  Activity Tolerance: Patient tolerated treatment well Patient left: in chair;with chair alarm set;with call bell/phone within reach;with nursing/sitter in room  OT Visit Diagnosis: Unsteadiness on feet (R26.81);Muscle weakness (generalized) (M62.81)                Time: 1191-4782 OT Time Calculation (min): 25 min Charges:  OT General Charges $OT Visit: 1 Visit OT Evaluation $OT Eval Moderate Complexity:  1 Mod OT Treatments $Therapeutic Activity: 8-22 mins  Jackquline Denmark, MS, OTR/L , CBIS ascom [phone]  02/26/20, 12:40 PM

## 2020-02-27 DIAGNOSIS — U071 COVID-19: Secondary | ICD-10-CM | POA: Diagnosis not present

## 2020-02-27 DIAGNOSIS — J1282 Pneumonia due to coronavirus disease 2019: Secondary | ICD-10-CM | POA: Diagnosis not present

## 2020-02-27 DIAGNOSIS — R0902 Hypoxemia: Secondary | ICD-10-CM | POA: Diagnosis not present

## 2020-02-27 LAB — CBC WITH DIFFERENTIAL/PLATELET
Abs Immature Granulocytes: 0.34 10*3/uL — ABNORMAL HIGH (ref 0.00–0.07)
Basophils Absolute: 0 10*3/uL (ref 0.0–0.1)
Basophils Relative: 0 %
Eosinophils Absolute: 0 10*3/uL (ref 0.0–0.5)
Eosinophils Relative: 0 %
HCT: 42.4 % (ref 39.0–52.0)
Hemoglobin: 14.4 g/dL (ref 13.0–17.0)
Immature Granulocytes: 3 %
Lymphocytes Relative: 4 %
Lymphs Abs: 0.5 10*3/uL — ABNORMAL LOW (ref 0.7–4.0)
MCH: 28.1 pg (ref 26.0–34.0)
MCHC: 34 g/dL (ref 30.0–36.0)
MCV: 82.8 fL (ref 80.0–100.0)
Monocytes Absolute: 0.3 10*3/uL (ref 0.1–1.0)
Monocytes Relative: 3 %
Neutro Abs: 9.4 10*3/uL — ABNORMAL HIGH (ref 1.7–7.7)
Neutrophils Relative %: 90 %
Platelets: 340 10*3/uL (ref 150–400)
RBC: 5.12 MIL/uL (ref 4.22–5.81)
RDW: 14.2 % (ref 11.5–15.5)
WBC: 10.5 10*3/uL (ref 4.0–10.5)
nRBC: 0 % (ref 0.0–0.2)

## 2020-02-27 LAB — COMPREHENSIVE METABOLIC PANEL
ALT: 23 U/L (ref 0–44)
AST: 21 U/L (ref 15–41)
Albumin: 2.8 g/dL — ABNORMAL LOW (ref 3.5–5.0)
Alkaline Phosphatase: 68 U/L (ref 38–126)
Anion gap: 6 (ref 5–15)
BUN: 39 mg/dL — ABNORMAL HIGH (ref 8–23)
CO2: 24 mmol/L (ref 22–32)
Calcium: 8.6 mg/dL — ABNORMAL LOW (ref 8.9–10.3)
Chloride: 109 mmol/L (ref 98–111)
Creatinine, Ser: 1 mg/dL (ref 0.61–1.24)
GFR, Estimated: >60 mL/min (ref >60)
Glucose, Bld: 160 mg/dL — ABNORMAL HIGH (ref 70–99)
Potassium: 4.3 mmol/L (ref 3.5–5.1)
Sodium: 139 mmol/L (ref 135–145)
Total Bilirubin: 0.7 mg/dL (ref 0.3–1.2)
Total Protein: 5.7 g/dL — ABNORMAL LOW (ref 6.5–8.1)

## 2020-02-27 LAB — FERRITIN: Ferritin: 594 ng/mL — ABNORMAL HIGH (ref 24–336)

## 2020-02-27 LAB — D-DIMER, QUANTITATIVE: D-Dimer, Quant: 1.67 ug/mL-FEU — ABNORMAL HIGH (ref 0.00–0.50)

## 2020-02-27 LAB — C-REACTIVE PROTEIN: CRP: 2.9 mg/dL — ABNORMAL HIGH (ref <1.0)

## 2020-02-27 MED ORDER — PREDNISONE 20 MG PO TABS
40.0000 mg | ORAL_TABLET | Freq: Two times a day (BID) | ORAL | Status: DC
  Administered 2020-02-27 – 2020-02-28 (×2): 40 mg via ORAL
  Filled 2020-02-27 (×2): qty 2

## 2020-02-27 NOTE — Progress Notes (Signed)
Timothy Giles  YWV:[id number] DOB: Jul 16, 1932 DOA: 02/23/2020 PCP: Jerl Mina, MD    Brief Narrative:  [phone] with a history of asthma, depression, HTN, and CAD status post PCI with stent who presented to the ER with acute onset of worsening shortness of breath and cough.  His wife had recently been hospitalized for Covid pneumonia.  Patient admitted to fever and chills for approximately 3 days intermittently.  In the ED oxygen saturations were noted to be 87% on room air.  CXR noted bibasilar interstitial opacities.  Date of Positive COVID Test:  02/23/2020  Vaccination Status: Pfizer x2  COVID-19 specific Treatment: Steroid 2/22 > Remdesivir 2/23 >  Antimicrobials:  Azithromycin 2/22 Rocephin 2/22  DVT prophylaxis: lovenox  Subjective: Afebrile.  Vital signs stable.  Continues to require 4 L conventional nasal cannula support with saturations in the 91-94% range.  Inflammatory markers trending downward. Wishes very strongly to be d/c home asap. Reports he feels dramatically improved.   Assessment & Plan:  COVID Pneumonia -acute hypoxic respiratory failure Significant infiltrate appreciated on CT chest -continues to require supportive supplemental oxygen - Remdesivir + Solu-Medrol - does not appear he will need Actemra - titrate oxygen as able - mobilize -wean steroid - plan for d/c home on supplemental O2 if remains stable over night (at his request)  Recent Labs  Lab 02/23/20 1240 02/24/20 1114 02/25/20 0346 02/26/20 0647 02/27/20 0336  DDIMER 1.42*  --  1.34* 1.59* 1.67*  FERRITIN 1,402*  --  1,446* 822* 594*  CRP  --  16.0* 9.8* 4.5*  --   ALT  --   --  20 21 23   PROCALCITON 0.14  --   --   --   --      Possible newly diagnosed CHF - ruled out TTE notes normal systolic LV fxn   Intermittent tachycardia EKG reveals evidence of frequent PACs which in the setting of his acute physiologic distress are likely the etiology of his frequent intermittent tachycardic  spells - he remains stable otherwise at present - low-dose beta-blocker initiated  HLD Hold medical therapy until Remdesivir completed and oral intake improved  CAD Asymptomatic presently  BPH Continue usual home medical therapy  Anxiety d/o  Continue usual home medical therapy  Code Status: FULL CODE Family Communication: No family present at time of exam Status is: Inpatient  Remains inpatient appropriate because:Inpatient level of care appropriate due to severity of illness   Dispo: The patient is from: Home              Anticipated d/c is to: Home              Anticipated d/c date is: 3 days              Patient currently is not medically stable to d/c.   Difficult to place patient No   Consultants:  none  Objective: Blood pressure 140/79, pulse (!) 57, temperature 98 F (36.7 C), temperature source Oral, resp. rate 20, height 5\' 10"  (1.778 m), weight 92.2 kg, SpO2 91 %.  Intake/Output Summary (Last 24 hours) at 02/27/2020 1018 Last data filed at 02/27/2020 0805 Gross per 24 hour  Intake 419.95 ml  Output 1475 ml  Net -1055.05 ml   Filed Weights   02/24/20 0304 02/25/20 0609 02/26/20 0338  Weight: 92.5 kg 90.8 kg 92.2 kg    Examination: General: Not in extremis - alert/conversant/pleasant  Lungs: Coarse crackles throughout - now wheeze  Cardiovascular: RRR - intermittent  ectopic beats - no M  Abdomen: NT/ND, soft, no rebound, BS positive Extremities: No C/C/E bilateral LE  CBC: Recent Labs  Lab 02/25/20 0346 02/26/20 0647 02/27/20 0336  WBC 11.2* 10.4 10.5  NEUTROABS 10.4* 9.5* 9.4*  HGB 13.7 14.3 14.4  HCT 39.3 42.2 42.4  MCV 82.7 82.7 82.8  PLT 325 330 340   Basic Metabolic Panel: Recent Labs  Lab 02/25/20 0346 02/26/20 0647 02/27/20 0336  NA 139 141 139  K 3.9 4.2 4.3  CL 109 111 109  CO2 23 24 24   GLUCOSE 149* 159* 160*  BUN 30* 39* 39*  CREATININE 0.88 0.95 1.00  CALCIUM 8.6* 8.7* 8.6*   GFR: Estimated Creatinine Clearance:  61.6 mL/min (by C-G formula based on SCr of 1 mg/dL).  Liver Function Tests: Recent Labs  Lab 02/25/20 0346 02/26/20 0647 02/27/20 0336  AST 21 19 21   ALT 20 21 23   ALKPHOS 81 74 68  BILITOT 0.8 0.6 0.7  PROT 5.7* 5.7* 5.7*  ALBUMIN 2.7* 2.8* 2.8*     Recent Results (from the past 240 hour(s))  Culture, blood (Routine X 2) w Reflex to ID Panel     Status: None (Preliminary result)   Collection Time: 02/23/20 12:40 PM   Specimen: BLOOD  Result Value Ref Range Status   Specimen Description BLOOD RIGHT [facility]  Final   Special Requests   Final    BOTTLES DRAWN AEROBIC AND ANAEROBIC Blood Culture adequate volume   Culture   Final    NO GROWTH 4 DAYS Performed at [facility], [address], [place], [facility] [address]    Report Status PENDING  Incomplete  Culture, blood (Routine X 2) w Reflex to ID Panel     Status: None (Preliminary result)   Collection Time: 02/23/20 12:40 PM   Specimen: BLOOD  Result Value Ref Range Status   Specimen Description BLOOD LEFT FA  Final   Special Requests   Final    BOTTLES DRAWN AEROBIC AND ANAEROBIC Blood Culture results may not be optimal due to an inadequate volume of blood received in culture bottles   Culture   Final    NO GROWTH 4 DAYS Performed at [facility], [address] [place], [facility] [address]    Report Status PENDING  Incomplete  SARS CORONAVIRUS 2 (TAT 6-24 HRS) Nasopharyngeal Nasopharyngeal Swab     Status: Abnormal   Collection Time: 02/23/20 12:40 PM   Specimen: Nasopharyngeal Swab  Result Value Ref Range Status   SARS Coronavirus 2 POSITIVE (A) NEGATIVE Final    Comment: (NOTE) SARS-CoV-2 target nucleic acids are DETECTED.  The SARS-CoV-2 RNA is generally detectable in upper and lower respiratory specimens during the acute phase of infection. Positive results are indicative of the presence of SARS-CoV-2 RNA. Clinical correlation with patient history and other diagnostic information is  necessary  to determine patient infection status. Positive results do not rule out bacterial infection or co-infection with other viruses.  The expected result is Negative.  Fact Sheet for Patients: [place]  Fact Sheet for Healthcare Providers: 42595  This test is not yet approved or cleared by the 02/25/20 FDA and  has been authorized for detection and/or diagnosis of SARS-CoV-2 by FDA under an Emergency Use Authorization (EUA). This EUA will remain  in effect (meaning this test can be used) for the duration of the COVID-19 declaration under Section 564(b)(1) of the Act, 21 U. S.C. section 360bbb-3(b)(1), unless the authorization is terminated or revoked  sooner.   Performed at [facility], [address], [place], [place] [address]   Culture, sputum-assessment     Status: None   Collection Time: 02/23/20  5:05 PM   Specimen: Expectorated Sputum  Result Value Ref Range Status   Specimen Description EXPECTORATED SPUTUM  Final   Special Requests NONE  Final   Sputum evaluation   Final    THIS SPECIMEN IS ACCEPTABLE FOR SPUTUM CULTURE Performed at [facility], [address], [place], [place] [address]    Report Status 02/23/2020 FINAL  Final  Culture, Respiratory w Gram Stain     Status: None   Collection Time: 02/23/20  5:05 PM  Result Value Ref Range Status   Specimen Description   Final    EXPECTORATED SPUTUM Performed at [facility], [address], [place], [place] [address]    Special Requests   Final    NONE Reflexed from [id number] Performed at [facility], [address] [place], [place] [address]    Gram Stain   Final    FEW WBC PRESENT, PREDOMINANTLY PMN MODERATE SQUAMOUS EPITHELIAL CELLS PRESENT MODERATE GRAM POSITIVE COCCI    Culture   Final    ABUNDANT Normal respiratory flora-no Staph aureus or Pseudomonas seen Performed at [facility], [address], [place], [place] [address]    Report Status 02/26/2020 FINAL  Final     Scheduled Meds: . vitamin C  500 mg Oral Daily  . aspirin EC  81 mg Oral Daily  . busPIRone  5 mg Oral BID  . diphenhydrAMINE  25 mg Oral QHS  . enoxaparin (LOVENOX) injection  40 mg Subcutaneous Q24H  . finasteride  5 mg Oral Daily  . guaiFENesin  600 mg Oral BID  . Ipratropium-Albuterol  2 puff Inhalation QID  . losartan  50 mg Oral Daily  . methylPREDNISolone (SOLU-MEDROL) injection  90 mg Intravenous Q12H  . metoprolol tartrate  12.5 mg Oral BID  . tamsulosin  0.4 mg Oral Daily  . terazosin  2 mg Oral QHS  . zinc sulfate  220 mg Oral Daily   Continuous Infusions: . remdesivir 100 mg in NS 100 mL 100 mg (02/27/20 0814)     LOS: 4 days   Lonia Blood, MD [facility] Office  [phone] Pager - Text Page per Loretha Stapler  If 7PM-7AM, please contact night-coverage per Amion 02/27/2020, 10:18 AM

## 2020-02-28 DIAGNOSIS — U071 COVID-19: Secondary | ICD-10-CM | POA: Diagnosis not present

## 2020-02-28 DIAGNOSIS — R0902 Hypoxemia: Secondary | ICD-10-CM | POA: Diagnosis not present

## 2020-02-28 DIAGNOSIS — J1282 Pneumonia due to coronavirus disease 2019: Secondary | ICD-10-CM | POA: Diagnosis not present

## 2020-02-28 LAB — CBC WITH DIFFERENTIAL/PLATELET
Abs Immature Granulocytes: 0.66 10*3/uL — ABNORMAL HIGH (ref 0.00–0.07)
Basophils Absolute: 0.1 10*3/uL (ref 0.0–0.1)
Basophils Relative: 1 %
Eosinophils Absolute: 0 10*3/uL (ref 0.0–0.5)
Eosinophils Relative: 0 %
HCT: 44.9 % (ref 39.0–52.0)
Hemoglobin: 15.6 g/dL (ref 13.0–17.0)
Immature Granulocytes: 6 %
Lymphocytes Relative: 8 %
Lymphs Abs: 0.9 10*3/uL (ref 0.7–4.0)
MCH: 29 pg (ref 26.0–34.0)
MCHC: 34.7 g/dL (ref 30.0–36.0)
MCV: 83.5 fL (ref 80.0–100.0)
Monocytes Absolute: 0.6 10*3/uL (ref 0.1–1.0)
Monocytes Relative: 6 %
Neutro Abs: 8.1 10*3/uL — ABNORMAL HIGH (ref 1.7–7.7)
Neutrophils Relative %: 79 %
Platelets: 352 10*3/uL (ref 150–400)
RBC: 5.38 MIL/uL (ref 4.22–5.81)
RDW: 14.1 % (ref 11.5–15.5)
Smear Review: NORMAL
WBC: 10.3 10*3/uL (ref 4.0–10.5)
nRBC: 0 % (ref 0.0–0.2)

## 2020-02-28 LAB — COMPREHENSIVE METABOLIC PANEL
ALT: 26 U/L (ref 0–44)
AST: 20 U/L (ref 15–41)
Albumin: 2.9 g/dL — ABNORMAL LOW (ref 3.5–5.0)
Alkaline Phosphatase: 67 U/L (ref 38–126)
Anion gap: 7 (ref 5–15)
BUN: 37 mg/dL — ABNORMAL HIGH (ref 8–23)
CO2: 24 mmol/L (ref 22–32)
Calcium: 8.5 mg/dL — ABNORMAL LOW (ref 8.9–10.3)
Chloride: 108 mmol/L (ref 98–111)
Creatinine, Ser: 0.89 mg/dL (ref 0.61–1.24)
GFR, Estimated: >60 mL/min (ref >60)
Glucose, Bld: 118 mg/dL — ABNORMAL HIGH (ref 70–99)
Potassium: 4 mmol/L (ref 3.5–5.1)
Sodium: 139 mmol/L (ref 135–145)
Total Bilirubin: 0.7 mg/dL (ref 0.3–1.2)
Total Protein: 5.7 g/dL — ABNORMAL LOW (ref 6.5–8.1)

## 2020-02-28 LAB — CULTURE, BLOOD (ROUTINE X 2)
Culture: NO GROWTH
Culture: NO GROWTH
Special Requests: ADEQUATE

## 2020-02-28 MED ORDER — PREDNISONE 20 MG PO TABS: ORAL_TABLET | ORAL | 0 refills | Status: DC

## 2020-02-28 MED ORDER — METOPROLOL TARTRATE 25 MG PO TABS: 12.5000 mg | ORAL_TABLET | Freq: Two times a day (BID) | ORAL | 0 refills | Status: AC

## 2020-02-28 MED ORDER — ACETAMINOPHEN 325 MG PO TABS: 650.0000 mg | ORAL_TABLET | Freq: Four times a day (QID) | ORAL | Status: AC | PRN

## 2020-02-28 NOTE — Discharge Summary (Signed)
DISCHARGE SUMMARY  Timothy Giles  MR#: [id number]  DOB:1932/02/26  Date of Admission: 02/23/2020 Date of Discharge: 02/28/2020  Attending Physician:Timothy Silvestre Gunner, MD  Patient's Timothy Giles, Timothy Fearing, MD  Consults: none   Disposition: D/C home   Date of Positive COVID Test: 02/23/20  Date COVID Isolation Ends: 03/05/20 (i.e. 03/04/20 is last fully day of isolation)  COVID-19 specific Treatment: Steroid 2/22 > Remdesivir 2/23 > 2/27  Follow-up Appts:  Follow-up Information    Jerl Mina, MD Follow up in 1 week(s).   Specialty: Family Medicine Contact information: [address] 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  His wife had recently been hospitalized for Covid pneumonia.  Patient admitted to fever and chills for approximately 3 days intermittently.  In the ED oxygen saturations were noted to be 87% on room air.  CXR noted bibasilar interstitial opacities.  Hospital Course:  COVID Pneumonia -acute hypoxic respiratory failure Significant infiltrate appreciated on CT chest - continues to require supportive supplemental oxygen at time of d/c at 4L [place] at all times - Remdesivir x5 doses completed - Solu-Medrol high dose weaned to steroids w/ slow taper to complete 10 days of tx - did not require Actemra - cont to mobilize - d/c home on supplemental O2 at his request as he is very anxious to go home   Intermittent tachycardia EKG revealed evidence of frequent PACs - likley exacerbated by  frequent use of albuterol nebs and his acute physiologic distress - he remained hemodynamically - low-dose beta-blocker initiated and can be d/c in f/u if remains stable   HLD Resume usual home tx at time of d/c   CAD Asymptomatic during this admissino   BPH Continue usual home medical therapy  Anxiety d/o  Continue usual home medical therapy  Allergies as of 02/28/2020      Reactions   Simvastatin    Other reaction(s): Unknown Patient unsure if medication give him a rash or a headache      Medication List    TAKE these medications   acetaminophen 325 MG tablet Commonly known as: TYLENOL Take 2 tablets (650 mg total) by mouth every 6 (six) hours as needed for mild pain (or Fever >/= 101).   Advair Diskus 250-50 MCG/DOSE Aepb Generic drug: Fluticasone-Salmeterol Inhale 1 puff into the lungs daily.   aspirin EC 81 MG tablet Take 81 mg by mouth daily.   atorvastatin 10 MG tablet Commonly known as: LIPITOR Take 10 mg by mouth daily.   busPIRone 5 MG tablet Commonly known as: BUSPAR Take 5 mg by mouth 2 (two) times daily.   finasteride 5 MG tablet Commonly known as: PROSCAR Take 5 mg by mouth daily.   losartan 25 MG tablet Commonly known as: COZAAR Take 25 mg by mouth daily.   metoprolol tartrate 25 MG tablet Commonly known as: LOPRESSOR Take 0.5 tablets (12.5 mg total) by mouth 2 (two) times daily.   predniSONE 20 MG tablet Commonly known as: DELTASONE 2 tablets two  times a day on 2/27 and 2/28 THEN 1 tablet two times a day on 3/1 and 3/2 THEN 1 tablet once a day on 3/3 and 3/4 THEN 1/2 tablet once a day on 3/5 and 3/6 then STOP   tamsulosin 0.4 MG Caps capsule Commonly known as: FLOMAX Take 0.4 mg by mouth daily.   terazosin 2 MG capsule Commonly known as: HYTRIN Take 2 mg by mouth 2 (two) times daily.            Durable Medical Equipment  (From admission, onward)         Start     Ordered   02/27/20 1711  For home use only DME oxygen   Once       Question Answer Comment  Length of Need 6 Months   Mode or (Route) Nasal cannula   Liters per Minute 4   Frequency Continuous (stationary and portable oxygen unit needed)   Oxygen conserving device Yes   Oxygen delivery system Gas      02/27/20 1711          Day of Discharge BP 129/81   Pulse 79   Temp (!) 97.4 F (36.3 C) (Axillary)   Resp (!) 24   Ht 5\' 10"  (1.778 m)   Wt 89.2 kg   SpO2 90%   BMI 28.22 kg/m   Physical Exam: General: No acute respiratory distress Lungs: scattered fine crackles - no wheezing  Cardiovascular: Regular rate and rhythm without murmur gallop or rub normal S1 and S2 Abdomen: Nontender, nondistended, soft, bowel sounds positive, no rebound, no ascites, no appreciable mass Extremities: No significant cyanosis, clubbing, or edema bilateral lower extremities  Basic Metabolic Panel: Recent Labs  Lab 02/24/20 0353 02/25/20 0346 02/26/20 0647 02/27/20 0336 02/28/20 0528  NA 138 139 141 139 139  K 3.9 3.9 4.2 4.3 4.0  CL 107 109 111 109 108  CO2 20* 23 24 24 24   GLUCOSE 145* 149* 159* 160* 118*  BUN 21 30* 39* 39* 37*  CREATININE 0.92 0.88 0.95 1.00 0.89  CALCIUM 8.8* 8.6* 8.7* 8.6* 8.5*    Liver Function Tests: Recent Labs  Lab 02/25/20 0346 02/26/20 0647 02/27/20 0336 02/28/20 0528  AST 21 19 21 20   ALT 20 21 23 26   ALKPHOS 81 74 68 67  BILITOT 0.8 0.6 0.7 0.7  PROT 5.7* 5.7* 5.7* 5.7*  ALBUMIN 2.7* 2.8* 2.8* 2.9*    CBC: Recent Labs  Lab 02/23/20 1142 02/24/20 0353 02/25/20 0346 02/26/20 0647 02/27/20 0336 02/28/20 0528  WBC 7.4 4.4 11.2* 10.4 10.5 10.3  NEUTROABS 6.5  --  10.4* 9.5* 9.4* 8.1*  HGB 14.4 14.0 13.7 14.3 14.4 15.6  HCT 42.9 42.1 39.3 42.2 42.4 44.9  MCV 83.1 84.0 82.7 82.7 82.8 83.5  PLT 242 274 325 330 340 352    Recent Results (from the past 240 hour(s))  Culture, blood (Routine X 2) w Reflex to ID Panel     Status: None   Collection Time: 02/23/20 12:40 PM   Specimen: BLOOD   Result Value Ref Range Status   Specimen Description BLOOD RIGHT [facility]  Final   Special Requests   Final    BOTTLES DRAWN AEROBIC AND ANAEROBIC Blood Culture adequate volume   Culture   Final    NO GROWTH 5 DAYS Performed at [facility], [address] [place], [facility]    Report Status 02/28/2020 FINAL  Final  Culture, blood (Routine X 2) w Reflex  to ID Panel     Status: None   Collection Time: 02/23/20 12:40 PM   Specimen: BLOOD  Result Value Ref Range Status   Specimen Description BLOOD LEFT FA  Final   Special Requests   Final    BOTTLES DRAWN AEROBIC AND ANAEROBIC Blood Culture results may not be optimal due to an inadequate volume of blood received in culture bottles   Culture   Final    NO GROWTH 5 DAYS Performed at [facility], [address] [place], [place] [address]    Report Status 02/28/2020 FINAL  Final  SARS CORONAVIRUS 2 (TAT 6-24 HRS) Nasopharyngeal Nasopharyngeal Swab     Status: Abnormal   Collection Time: 02/23/20 12:40 PM   Specimen: Nasopharyngeal Swab  Result Value Ref Range Status   SARS Coronavirus 2 POSITIVE (A) NEGATIVE Final    Comment: (NOTE) SARS-CoV-2 target nucleic acids are DETECTED.  The SARS-CoV-2 RNA is generally detectable in upper and lower respiratory specimens during the acute phase of infection. Positive results are indicative of the presence of SARS-CoV-2 RNA. Clinical correlation with patient history and other diagnostic information is  necessary to determine patient infection status. Positive results do not rule out bacterial infection or co-infection with other viruses.  The expected result is Negative.  Fact Sheet for Patients: HairSlick.no  Fact Sheet for Healthcare Providers: quierodirigir.com  This test is not yet approved or cleared by the [place] FDA and  has been authorized for detection and/or diagnosis of SARS-CoV-2 by FDA under  an Emergency Use Authorization (EUA). This EUA will remain  in effect (meaning this test can be used) for the duration of the COVID-19 declaration under Section 564(b)(1) of the Act, 21 U. S.C. section 360bbb-3(b)(1), unless the authorization is terminated or revoked sooner.   Performed at [facility], [address], [place], [place] [address]   Culture, sputum-assessment     Status: None   Collection Time: 02/23/20  5:05 PM   Specimen: Expectorated Sputum  Result Value Ref Range Status   Specimen Description EXPECTORATED SPUTUM  Final   Special Requests NONE  Final   Sputum evaluation   Final    THIS SPECIMEN IS ACCEPTABLE FOR SPUTUM CULTURE Performed at [facility], [address] [place], [place] [address]    Report Status 02/23/2020 FINAL  Final  Culture, Respiratory w Gram Stain     Status: None   Collection Time: 02/23/20  5:05 PM  Result Value Ref Range Status   Specimen Description   Final    EXPECTORATED SPUTUM Performed at [facility], [address] [place], [place] [address]    Special Requests   Final    NONE Reflexed from [id number] Performed at [facility], [address] [place], [place] [address]    Gram Stain   Final    FEW WBC PRESENT, PREDOMINANTLY PMN MODERATE SQUAMOUS EPITHELIAL CELLS PRESENT MODERATE GRAM POSITIVE COCCI    Culture   Final    ABUNDANT Normal respiratory flora-no Staph aureus or Pseudomonas seen Performed at [facility], [address] [place], [place] [address]    Report Status 02/26/2020 FINAL  Final      Time spent in discharge (includes decision making & examination of pt): 35 minutes  02/28/2020, 10:55 AM   Lonia Blood, MD [facility] Office  [phone]

## 2020-02-28 NOTE — TOC Transition Note (Signed)
Transition of Care [facility]) - CM/SW Discharge Note   Patient Details  Name: Timothy Giles MRN: [id number] Date of Birth: [birth year]  Transition of Care [facility]) CM/SW Contact:  Luvenia Redden, RN Phone Number: [phone] 02/28/2020, 3:22 PM   Clinical Narrative:    Spoke with pt who requested to speak with his wife Dois Davenport [phone]). RN spoke with pt concerning discharge plans for the requested services. Wife agreed to agencies for the following services. [employer] Vaughan Basta) for home O2 with portable tank to be delivered to the pt's room along with the rolling walker. [facility] Low[facility] Foster) who was able to arrange HHPT/OT in the home. Informed the pt's wife and notified the team on all arrangements. Pt lives in an apartment on the first floor.   Wife will be transporting pt home and picking up all medications at [facility]. Wife indicated she would be able to transport pt to his medical appointments with other needs.   No additional needs at this time.    Final next level of care: Home w Home Health Services Barriers to Discharge: No Barriers Identified   Patient Goals and CMS Choice        Discharge Placement                  Name of family member notified: Dois Davenport (spouse) Patient and family notified of of transfer: 02/28/20  Discharge Plan and Services                            [facility] Agency: [facility] Date [facility] Agency Contacted: 02/28/20 Time HH Agency Contacted: 1500 Representative spoke with at [facility] Agency: BRITTANY  Social Determinants of Health (SDOH) Interventions     Readmission Risk Interventions No flowsheet data found.

## 2020-02-28 NOTE — Discharge Instructions (Signed)
Date of Positive COVID Test: 02/23/20  Date COVID Isolation Ends: 03/05/20 (i.e. 03/04/20 is last fully day of isolation)      Person Under Monitoring Name: Timothy Giles  Location: [address] [place] [address]   Infection Prevention Recommendations for Individuals Confirmed to have, or Being Evaluated for, 2019 Novel Coronavirus (COVID-19) Infection Who Receive Care at Home  Individuals who are confirmed to have, or are being evaluated for, COVID-19 should follow the prevention steps below until a healthcare provider or local or state health department says they can return to normal activities.  Stay home except to get medical care You should restrict activities outside your home, except for getting medical care. Do not go to work, school, or public areas, and do not use public transportation or taxis.  Call ahead before visiting your doctor Before your medical appointment, call the healthcare provider and tell them that you have, or are being evaluated for, COVID-19 infection. This will help the healthcare provider's office take steps to keep other people from getting infected. Ask your healthcare provider to call the local or state health department.  Monitor your symptoms Seek prompt medical attention if your illness is worsening (e.g., difficulty breathing). Before going to your medical appointment, call the healthcare provider and tell them that you have, or are being evaluated for, COVID-19 infection. Ask your healthcare provider to call the local or state health department.  Wear a facemask You should wear a facemask that covers your nose and mouth when you are in the same room with other people and when you visit a healthcare provider. People who live with or visit you should also wear a facemask while they are in the same room with you.  Separate yourself from other people in your home As much as possible, you should stay in a different room from  other people in your home. Also, you should use a separate bathroom, if available.  Avoid sharing household items You should not share dishes, drinking glasses, cups, eating utensils, towels, bedding, or other items with other people in your home. After using these items, you should wash them thoroughly with soap and water.  Cover your coughs and sneezes Cover your mouth and nose with a tissue when you cough or sneeze, or you can cough or sneeze into your sleeve. Throw used tissues in a lined trash can, and immediately wash your hands with soap and water for at least 20 seconds or use an alcohol-based hand rub.  Wash your [employer] your hands often and thoroughly with soap and water for at least 20 seconds. You can use an alcohol-based hand sanitizer if soap and water are not available and if your hands are not visibly dirty. Avoid touching your eyes, nose, and mouth with unwashed hands.   Prevention Steps for Caregivers and Household Members of Individuals Confirmed to have, or Being Evaluated for, COVID-19 Infection Being Cared for in the Home  If you live with, or provide care at home for, a person confirmed to have, or being evaluated for, COVID-19 infection please follow these guidelines to prevent infection:  Follow healthcare provider's instructions Make sure that you understand and can help the patient follow any healthcare provider instructions for all care.  Provide for the patient's basic needs You should help the patient with basic needs in the home and provide support for getting groceries, prescriptions, and other personal needs.  Monitor the patient's symptoms If they are getting sicker, call his or  her medical provider and tell them that the patient has, or is being evaluated for, COVID-19 infection. This will help the healthcare provider's office take steps to keep other people from getting infected. Ask the healthcare provider to call the local or state health  department.  Limit the number of people who have contact with the patient  If possible, have only one caregiver for the patient.  Other household members should stay in another home or place of residence. If this is not possible, they should stay  in another room, or be separated from the patient as much as possible. Use a separate bathroom, if available.  Restrict visitors who do not have an essential need to be in the home.  Keep older adults, very young children, and other sick people away from the patient Keep older adults, very young children, and those who have compromised immune systems or chronic health conditions away from the patient. This includes people with chronic heart, lung, or kidney conditions, diabetes, and cancer.  Ensure good ventilation Make sure that shared spaces in the home have good air flow, such as from an air conditioner or an opened window, weather permitting.  Wash your hands often  Wash your hands often and thoroughly with soap and water for at least 20 seconds. You can use an alcohol based hand sanitizer if soap and water are not available and if your hands are not visibly dirty.  Avoid touching your eyes, nose, and mouth with unwashed hands.  Use disposable paper towels to dry your hands. If not available, use dedicated cloth towels and replace them when they become wet.  Wear a facemask and gloves  Wear a disposable facemask at all times in the room and gloves when you touch or have contact with the patient's blood, body fluids, and/or secretions or excretions, such as sweat, saliva, sputum, nasal mucus, vomit, urine, or feces.  Ensure the mask fits over your nose and mouth tightly, and do not touch it during use.  Throw out disposable facemasks and gloves after using them. Do not reuse.  Wash your hands immediately after removing your facemask and gloves.  If your personal clothing becomes contaminated, carefully remove clothing and launder. Wash  your hands after handling contaminated clothing.  Place all used disposable facemasks, gloves, and other waste in a lined container before disposing them with other household waste.  Remove gloves and wash your hands immediately after handling these items.  Do not share dishes, glasses, or other household items with the patient  Avoid sharing household items. You should not share dishes, drinking glasses, cups, eating utensils, towels, bedding, or other items with a patient who is confirmed to have, or being evaluated for, COVID-19 infection.  After the person uses these items, you should wash them thoroughly with soap and water.  Wash laundry thoroughly  Immediately remove and wash clothes or bedding that have blood, body fluids, and/or secretions or excretions, such as sweat, saliva, sputum, nasal mucus, vomit, urine, or feces, on them.  Wear gloves when handling laundry from the patient.  Read and follow directions on labels of laundry or clothing items and detergent. In general, wash and dry with the warmest temperatures recommended on the label.  Clean all areas the individual has used often  Clean all touchable surfaces, such as counters, tabletops, doorknobs, bathroom fixtures, toilets, phones, keyboards, tablets, and bedside tables, every day. Also, clean any surfaces that may have blood, body fluids, and/or secretions or excretions on them.  Wear gloves when cleaning surfaces the patient has come in contact with.  Use a diluted bleach solution (e.g., dilute bleach with 1 part bleach and 10 parts water) or a household disinfectant with a label that says EPA-registered for coronaviruses. To make a bleach solution at home, add 1 tablespoon of bleach to 1 quart (4 cups) of water. For a larger supply, add  cup of bleach to 1 gallon (16 cups) of water.  Read labels of cleaning products and follow recommendations provided on product labels. Labels contain instructions for safe and  effective use of the cleaning product including precautions you should take when applying the product, such as wearing gloves or eye protection and making sure you have good ventilation during use of the product.  Remove gloves and wash hands immediately after cleaning.  Monitor yourself for signs and symptoms of illness Caregivers and household members are considered close contacts, should monitor their health, and will be asked to limit movement outside of the home to the extent possible. Follow the monitoring steps for close contacts listed on the symptom monitoring form.   ? If you have additional questions, contact your local health department or call the epidemiologist on call at [phone] (available 24/7). ? This guidance is subject to change. For the most up-to-date guidance from [facility], please refer to their website: TripMetro.hu  10 Things You Can Do to Manage Your COVID-19 Symptoms at Home If you have possible or confirmed COVID-19: 1. Stay home except to get medical care. 2. Monitor your symptoms carefully. If your symptoms get worse, call your healthcare provider immediately. 3. Get rest and stay hydrated. 4. If you have a medical appointment, call the healthcare provider ahead of time and tell them that you have or may have COVID-19. 5. For medical emergencies, call 911 and notify the dispatch personnel that you have or may have COVID-19. 6. Cover your cough and sneezes with a tissue or use the inside of your elbow. 7. Wash your hands often with soap and water for at least 20 seconds or clean your hands with an alcohol-based hand sanitizer that contains at least 60% alcohol. 8. As much as possible, stay in a specific room and away from other people in your home. Also, you should use a separate bathroom, if available. If you need to be around other people in or outside of the home, wear a mask. 9. Avoid sharing  personal items with other people in your household, like dishes, towels, and bedding. 10. Clean all surfaces that are touched often, like counters, tabletops, and doorknobs. Use household cleaning sprays or wipes according to the label instructions. [employer] 07/17/2019 This information is not intended to replace advice given to you by your health care provider. Make sure you discuss any questions you have with your health care provider. Document Revised: 11/02/2019 Document Reviewed: 11/02/2019 Elsevier Patient Education  2021 [employer].

## 2020-02-28 NOTE — Progress Notes (Signed)
Timothy Giles to be D/C'd Home per MD order.  Discussed prescriptions and follow up appointments with the patient. Prescriptions electronically submitted, medication list explained in detail. Pt verbalized understanding. Personal o2 tank delivered at bedside, sent home with patient as well as walker  Allergies as of 02/28/2020      Reactions   Simvastatin    Other reaction(s): Unknown Patient unsure if medication give him a rash or a headache      Medication List    TAKE these medications   acetaminophen 325 MG tablet Commonly known as: TYLENOL Take 2 tablets (650 mg total) by mouth every 6 (six) hours as needed for mild pain (or Fever >/= 101).   Advair Diskus 250-50 MCG/DOSE Aepb Generic drug: Fluticasone-Salmeterol Inhale 1 puff into the lungs daily.   aspirin EC 81 MG tablet Take 81 mg by mouth daily.   atorvastatin 10 MG tablet Commonly known as: LIPITOR Take 10 mg by mouth daily.   busPIRone 5 MG tablet Commonly known as: BUSPAR Take 5 mg by mouth 2 (two) times daily.   finasteride 5 MG tablet Commonly known as: PROSCAR Take 5 mg by mouth daily.   losartan 25 MG tablet Commonly known as: COZAAR Take 25 mg by mouth daily.   metoprolol tartrate 25 MG tablet Commonly known as: LOPRESSOR Take 0.5 tablets (12.5 mg total) by mouth 2 (two) times daily.   predniSONE 20 MG tablet Commonly known as: DELTASONE 2 tablets two times a day on 2/27 and 2/28 THEN 1 tablet two times a day on 3/1 and 3/2 THEN 1 tablet once a day on 3/3 and 3/4 THEN 1/2 tablet once a day on 3/5 and 3/6 then STOP   tamsulosin 0.4 MG Caps capsule Commonly known as: FLOMAX Take 0.4 mg by mouth daily.   terazosin 2 MG capsule Commonly known as: HYTRIN Take 2 mg by mouth 2 (two) times daily.            Durable Medical Equipment  (From admission, onward)         Start     Ordered   02/28/20 1057  For home use only DME Walker rolling  Once       Question Answer Comment  Walker: With 5  Inch Wheels   Patient needs a walker to treat with the following condition Pneumonia due to COVID-19 virus      02/28/20 1056   02/27/20 1711  For home use only DME oxygen  Once       Question Answer Comment  Length of Need 6 Months   Mode or (Route) Nasal cannula   Liters per Minute 4   Frequency Continuous (stationary and portable oxygen unit needed)   Oxygen conserving device Yes   Oxygen delivery system Gas      02/27/20 1711          Vitals:   02/28/20 1415 02/28/20 1447  BP:  117/82  Pulse:  76  Resp:  (!) 24  Temp:  97.8 F (36.6 C)  SpO2: 95% 95%    Skin clean, dry and intact without evidence of skin break down, no evidence of skin tears noted. IV catheter discontinued intact. Site without signs and symptoms of complications. Dressing and pressure applied. Pt denies pain at this time. No complaints noted.  An After Visit Summary was printed and given to the patient. Patient escorted via WC, and D/C home via private auto.  Timothy Giles Timothy Giles

## 2020-02-28 NOTE — Progress Notes (Signed)
SATURATION QUALIFICATIONS: (This note is used to comply with regulatory documentation for home oxygen)  Patient Saturations on Room Air at Rest = 87%  Patient Saturations on Room Air while Ambulating = 87%  Patient Saturations on 4 Liters of oxygen while Ambulating = 95%  Please briefly explain why patient needs home oxygen:

## 2021-06-21 ENCOUNTER — Other Ambulatory Visit: Payer: Self-pay | Admitting: Orthopedic Surgery

## 2021-06-21 DIAGNOSIS — R200 Anesthesia of skin: Secondary | ICD-10-CM

## 2021-06-22 ENCOUNTER — Ambulatory Visit
Admission: RE | Admit: 2021-06-22 | Discharge: 2021-06-22 | Disposition: A | Discharge status: Home / Self Care | Payer: Medicare HMO | Source: Ambulatory Visit | Attending: Orthopedic Surgery | Admitting: Orthopedic Surgery

## 2021-06-22 DIAGNOSIS — R202 Paresthesia of skin: Secondary | ICD-10-CM | POA: Diagnosis present

## 2021-06-22 DIAGNOSIS — R200 Anesthesia of skin: Secondary | ICD-10-CM | POA: Diagnosis present

## 2021-07-19 NOTE — Progress Notes (Signed)
Referring Physician:  Kennedy Bucker, MD [id number] [facility] [place],  [place] [address]  Primary Physician:  Jerl Mina, MD  History of Present Illness: 07/19/2021 Mr. Timothy Giles is here today with a chief complaint of tingling in his neck.  He has numbness in his first 3 fingers of both hands.  He has no balance issues.  He has no weakness.  He has some mild dexterity changes, but nothing recent.  He denies falls. Bowel/Bladder Dysfunction: none  Conservative measures: chiropractor Physical therapy: has not participated Multimodal medical therapy including regular antiinflammatories:  prednisone, tylenol Injections: has not received epidural steroid injections  Past Surgery: denies  Lamir Schaff has no symptoms of cervical myelopathy.  The symptoms are causing a significant impact on the patient's life.   Review of Systems:  A 10 point review of systems is negative, except for the pertinent positives and negatives detailed in the HPI.  Past Medical History: Past Medical History:  Diagnosis Date   Abdominal aortic aneurysm (AAA) without rupture (HCC) 06/13/2015   Asthma    Depression    Essential (primary) hypertension 06/13/2015   Heart disease    History of elevated PSA    x2 neg. biopsy x 4 yrs ago   History of hematuria    cysto x ago, (-)neg. hematuria work     Presence of coronary angioplasty implant and graft 06/13/2015   Overview:  Prox RCA, 03/01/2008    Pure hypercholesterolemia 06/13/2015    Past Surgical History: Past Surgical History:  Procedure Laterality Date   ABDOMINAL AORTIC ANEURYSM REPAIR     CORONARY ANGIOPLASTY WITH STENT PLACEMENT     TOTAL HIP ARTHROPLASTY  2006    Allergies: Allergies as of 07/20/2021 - Review Complete 02/23/2020  Allergen Reaction Noted   Simvastatin  06/24/2015    Medications: No outpatient medications have been marked as taking for the 07/20/21 encounter (Appointment) with  Venetia Night, MD.    Social History: Social History   Tobacco Use   Smoking status: Former   Smokeless tobacco: Never  Substance Use Topics   Alcohol use: Never    Alcohol/week: 0.0 standard drinks of alcohol   Drug use: Never    Family Medical History: Family History  Problem Relation Age of Onset   Prostate cancer Brother    Bladder Cancer Neg Hx    Kidney cancer Neg Hx     Physical Examination: There were no vitals filed for this visit.  General: Patient is well developed, well nourished, calm, collected, and in no apparent distress. Attention to examination is appropriate.  Neck:   Supple.  Full range of motion.  Respiratory: Patient is breathing without any difficulty.   NEUROLOGICAL:     Awake, alert, oriented to person, place, and time.  Speech is clear and fluent. Fund of knowledge is appropriate.   Cranial Nerves: Pupils equal round and reactive to light.  Facial tone is symmetric.  Facial sensation is symmetric. Shoulder shrug is symmetric. Tongue protrusion is midline.  There is no pronator drift.  ROM of spine: full.    Strength: Side Biceps Triceps Deltoid Interossei Grip Wrist Ext. Wrist Flex.  R 5 5 5 5 5 5 5   L 5 5 5 5 5 5 5    Side Iliopsoas Quads Hamstring PF DF EHL  R 5 5 5 5 5 5   L 5 5 5 5 5 5    Reflexes are 1+ and symmetric at the biceps,  triceps, brachioradialis, patella and achilles.   Hoffman's is absent.  Clonus is not present.  Toes are down-going.  Bilateral upper and lower extremity sensation is intact to light touch.    No evidence of dysmetria noted.  Gait is normal.     Medical Decision Making  Imaging: MRI C spine 06/22/21 IMPRESSION: 1. Advanced cervical disc and facet degeneration. 2. Moderate spinal stenosis and severe left neural foraminal stenosis at C3-4. 3. Moderate right neural foraminal stenosis at C6-7 and C7-T1.     Electronically Signed   By: Sebastian Ache M.D.   On: 06/22/2021 14:46   I have  personally reviewed the images and agree with the above interpretation.  Assessment and Plan: Mr. Bramble is a pleasant 88 y.o. male with cervical stenosis at C3-4 without any obvious clinical myelopathy.  We discussed the warning signs for myelopathy.  At this point, I recommended observation.  I will see him back in 3 months to review his symptoms.   I spent a total of 30 minutes in face-to-face and non-face-to-face activities related to this patient's care today.  Thank you for involving me in the care of this patient.      Naeem Quillin K. Myer Haff MD, [facility] Neurosurgery

## 2021-07-20 ENCOUNTER — Encounter: Payer: Self-pay | Admitting: Neurosurgery

## 2021-07-20 ENCOUNTER — Ambulatory Visit: Payer: Medicare HMO | Admitting: Neurosurgery

## 2021-07-20 VITALS — BP 135/81 | HR 71 | Ht 70.0 in | Wt 207.2 lb

## 2021-07-20 DIAGNOSIS — M4802 Spinal stenosis, cervical region: Secondary | ICD-10-CM

## 2021-08-10 ENCOUNTER — Ambulatory Visit: Payer: Non-veteran care | Admitting: Neurosurgery

## 2021-10-24 ENCOUNTER — Ambulatory Visit: Payer: Non-veteran care | Admitting: Neurosurgery

## 2021-12-07 ENCOUNTER — Emergency Department
Admission: EM | Admit: 2021-12-07 | Discharge: 2021-12-07 | Disposition: A | Discharge status: Home / Self Care | Payer: No Typology Code available for payment source | Attending: Emergency Medicine | Admitting: Emergency Medicine

## 2021-12-07 ENCOUNTER — Other Ambulatory Visit: Payer: Self-pay

## 2021-12-07 DIAGNOSIS — E876 Hypokalemia: Secondary | ICD-10-CM | POA: Diagnosis not present

## 2021-12-07 DIAGNOSIS — J45909 Unspecified asthma, uncomplicated: Secondary | ICD-10-CM | POA: Insufficient documentation

## 2021-12-07 DIAGNOSIS — R197 Diarrhea, unspecified: Secondary | ICD-10-CM | POA: Insufficient documentation

## 2021-12-07 DIAGNOSIS — N289 Disorder of kidney and ureter, unspecified: Secondary | ICD-10-CM | POA: Insufficient documentation

## 2021-12-07 DIAGNOSIS — N179 Acute kidney failure, unspecified: Secondary | ICD-10-CM

## 2021-12-07 LAB — GASTROINTESTINAL PANEL BY PCR, STOOL (REPLACES STOOL CULTURE)

## 2021-12-07 LAB — CBC WITH DIFFERENTIAL/PLATELET
Abs Immature Granulocytes: 0.03 10*3/uL (ref 0.00–0.07)
Basophils Absolute: 0 10*3/uL (ref 0.0–0.1)
Basophils Relative: 0 %
Eosinophils Absolute: 0.2 10*3/uL (ref 0.0–0.5)
Eosinophils Relative: 3 %
HCT: 44.5 % (ref 39.0–52.0)
Hemoglobin: 14.7 g/dL (ref 13.0–17.0)
Immature Granulocytes: 0 %
Lymphocytes Relative: 18 %
Lymphs Abs: 1.2 10*3/uL (ref 0.7–4.0)
MCH: 27.8 pg (ref 26.0–34.0)
MCHC: 33 g/dL (ref 30.0–36.0)
MCV: 84.1 fL (ref 80.0–100.0)
Monocytes Absolute: 0.5 10*3/uL (ref 0.1–1.0)
Monocytes Relative: 7 %
Neutro Abs: 4.7 10*3/uL (ref 1.7–7.7)
Neutrophils Relative %: 72 %
Platelets: 229 10*3/uL (ref 150–400)
RBC: 5.29 MIL/uL (ref 4.22–5.81)
RDW: 14.6 % (ref 11.5–15.5)
WBC: 6.7 10*3/uL (ref 4.0–10.5)
nRBC: 0 % (ref 0.0–0.2)

## 2021-12-07 LAB — C DIFFICILE QUICK SCREEN W PCR REFLEX
C Diff antigen: NEGATIVE
C Diff interpretation: NOT DETECTED
C Diff toxin: NEGATIVE

## 2021-12-07 LAB — COMPREHENSIVE METABOLIC PANEL
ALT: 25 U/L (ref 0–44)
AST: 30 U/L (ref 15–41)
Albumin: 3.9 g/dL (ref 3.5–5.0)
Alkaline Phosphatase: 71 U/L (ref 38–126)
Anion gap: 5 (ref 5–15)
BUN: 15 mg/dL (ref 8–23)
CO2: 21 mmol/L — ABNORMAL LOW (ref 22–32)
Calcium: 8.7 mg/dL — ABNORMAL LOW (ref 8.9–10.3)
Chloride: 111 mmol/L (ref 98–111)
Creatinine, Ser: 1.25 mg/dL — ABNORMAL HIGH (ref 0.61–1.24)
GFR, Estimated: 56 mL/min — ABNORMAL LOW (ref >60)
Glucose, Bld: 110 mg/dL — ABNORMAL HIGH (ref 70–99)
Potassium: 3.4 mmol/L — ABNORMAL LOW (ref 3.5–5.1)
Sodium: 137 mmol/L (ref 135–145)
Total Bilirubin: 0.9 mg/dL (ref 0.3–1.2)
Total Protein: 6.2 g/dL — ABNORMAL LOW (ref 6.5–8.1)

## 2021-12-07 LAB — MAGNESIUM: Magnesium: 2.1 mg/dL (ref 1.7–2.4)

## 2021-12-07 MED ORDER — SODIUM CHLORIDE 0.9 % IV BOLUS
1000.0000 mL | Freq: Once | INTRAVENOUS | Status: AC
  Administered 2021-12-07: 1000 mL via INTRAVENOUS

## 2021-12-07 MED ORDER — POTASSIUM CHLORIDE CRYS ER 20 MEQ PO TBCR
40.0000 meq | EXTENDED_RELEASE_TABLET | Freq: Once | ORAL | Status: AC
  Administered 2021-12-07: 40 meq via ORAL
  Filled 2021-12-07: qty 2

## 2021-12-07 NOTE — ED Notes (Signed)
First Nurse Note: Pt to ED via POV for diarrhea. Pt seen his MD last week and was given medication but he is still having diarrhea. Pt is in NAD.

## 2021-12-07 NOTE — ED Triage Notes (Signed)
Pt presents to ED with c/o of diarrhea since Thursday. Pt states he was told he had a "bacterial viral infection". Pt states recent ABX use. Pt denies fevers or chills. Pt is A&Ox4.

## 2021-12-07 NOTE — Discharge Instructions (Addendum)
Today well-hydrated by drinking plenty of fluids.  Find Pedialyte or similar electrolyte replacement formulas at your local pharmacy.  If you continue to have diarrhea, follow-up with your primary doctor early next week.  Have any new or worsening symptoms including but not limited to signs of dehydration as we discussed, bleeding, pain, fever then you must come back to the emergency department immediately.  Thank you for choosing [place] for your health care today!  Please see your primary doctor this week for a follow up appointment.   Sometimes, in the early stages of certain disease courses it is difficult to detect in the emergency department evaluation -- so, it is important that you continue to monitor your symptoms and call your doctor right away or return to the emergency department if you develop any new or worsening symptoms.  Please go to the following website to schedule new (and existing) patient appointments:   [email or web address]  If you do not have a primary doctor try calling the following clinics to establish care:  If you have insurance:  [facility] [phone] [address] [place] [place] [address]   [facility]  [phone] [address] [place] [place] [address]   If you do not have insurance:  [facility]  [phone] [address] [place] [address]   The following is another list of primary care offices in the area who are accepting new patients at this time.  Please reach out to one of them directly and let them know you would like to schedule an appointment to follow up on an Emergency Department visit, and/or to establish a new primary care provider (PCP).  There are likely other primary care clinics in the are who are accepting new patients, but this is an excellent place to start:  [facility] Lead physician: Dr Shirlee Latch [address] [place], [place] [address] [phone]  [facility] Lead Physician: Dr Alba Cory [address], [place], [place] [address] [phone]  [facility]  Lead Physician: Dr Olevia Perches [address] [place], [place] [address] [phone]  [facility] Lead Physician: Dr Sofie Hartigan [address] [place], [place], [place] [address] [phone]  [facility] Lead Physician: Dr Bari Edward [address] [place], [place] [address] [phone]   It was my pleasure to care for you today.   Daneil Dan Modesto Charon, MD

## 2021-12-07 NOTE — ED Provider Notes (Signed)
[facility] Provider Note    Event Date/Time   First MD Initiated Contact with Patient 12/07/21 1720     (approximate)   History   Diarrhea   HPI  Timothy Giles is a 88 y.o. male   Past medical history of hypertension, AAA, hyperlipidemia, depression and asthma who presents to the emergency department with diarrhea for 5 days.  Watery stools, no blood, no fever no abdominal pain.  No urinary symptoms.  Beginning of this diarrheal course she was prescribed antibiotics which she has taken and completed with no resolution of symptoms.  Denies lightheadedness, shortness of breath or any other medical complaints other than ongoing watery stools.  Around Thanksgiving he was exposed to another individual who came for Thanksgiving dinner who had similar diarrheal illness.  He has had no other exposure to antibiotics recently and has had no travel outside of the country.  History was obtained via the patient.  This information was corroborated by independent story and wife who is at bedside.      Physical Exam   Triage Vital Signs: ED Triage Vitals [12/07/21 1316]  Enc Vitals Group     BP 109/66     Pulse Rate 85     Resp 20     Temp 97.9 F (36.6 C)     Temp Source Oral     SpO2 95 %     Weight      Height      Head Circumference      Peak Flow      Pain Score 0     Pain Loc      Pain Edu?      Excl. in GC?     Most recent vital signs: Vitals:   12/07/21 1619 12/07/21 1708  BP: 123/80 132/84  Pulse: 74 85  Resp: 20 16  Temp: 97.6 F (36.4 C) (!) 97.4 F (36.3 C)  SpO2: 95% 96%    General: Awake, no distress.  CV:  Good peripheral perfusion.  Resp:  Normal effort.  Abd:  No distention.  Other:  Dynamics appropriate and reassuring and he appears comfortable and euvolemic.  His abdomen is soft and nontender to palpation in all quadrants.  He deferred rectal exam stating that he has adamant that he has had no bleeding in his stools and  that he does not take blood thinners.   ED Results / Procedures / Treatments   Labs (all labs ordered are listed, but only abnormal results are displayed) Labs Reviewed  COMPREHENSIVE METABOLIC PANEL - Abnormal; Notable for the following components:      Result Value   Potassium 3.4 (*)    CO2 21 (*)    Glucose, Bld 110 (*)    Creatinine, Ser 1.25 (*)    Calcium 8.7 (*)    Total Protein 6.2 (*)    GFR, Estimated 56 (*)    All other components within normal limits  GASTROINTESTINAL PANEL BY PCR, STOOL (REPLACES STOOL CULTURE)  C DIFFICILE QUICK SCREEN W PCR REFLEX    CBC WITH DIFFERENTIAL/PLATELET  MAGNESIUM     I reviewed labs and they are notable for a creatinine that is elevated from prior at 1.25 and also some mild potassium depletion at 3.4 I also reviewed stool pathogens which is negative for all bacteria and the analysis and negative for C. difficile.  PROCEDURES:  Critical Care performed: No  Procedures   MEDICATIONS ORDERED IN ED: Medications  sodium chloride  0.9 % bolus 1,000 mL (has no administration in time range)  potassium chloride SA (KLOR-CON M) CR tablet 40 mEq (40 mEq Oral Given 12/07/21 1755)     IMPRESSION / MDM / ASSESSMENT AND PLAN / ED COURSE  I reviewed the triage vital signs and the nursing notes.                              Differential diagnosis includes, but is not limited to, viral gastroenteritis, dysentery, bacterial infection, colitis, intra-abdominal infections, dehydration, blood loss   The patient is on the cardiac monitor to evaluate for evidence of arrhythmia and/or significant heart rate changes.  MDM: Patient with watery diarrhea with no fever, pain, hemodynamics are reassuring and nontender abdomen.  He has a mildly elevated creatinine likely prerenal and also some mild hypokalemia.  I will give a liter of normal saline and oral potassium repletion and emphasized ongoing hydration at home and electrolyte replacements and  follow-up with PMD.  I considered intra-abdominal infections but with a completely benign abdominal exam and no other red flag symptoms I think this is less likely and I spoke with the patient and his wife and we all agreed for deferring CT scan or further workup at this point.  He will continue with conservative management and follow-up with his PMD.  He understands to return to the emergency department immediately if he develops any symptoms worsening including but not limited to pain, signs of dehydration, bleeding, fever.   Patient's presentation is most consistent with acute presentation with potential threat to life or bodily function.       FINAL CLINICAL IMPRESSION(S) / ED DIAGNOSES   Final diagnoses:  Diarrhea, unspecified type  AKI (acute kidney injury) (HCC)  Hypokalemia     Rx / DC Orders   ED Discharge Orders     None        Note:  This document was prepared using Dragon voice recognition software and may include unintentional dictation errors.    Pilar Jarvis, MD 12/07/21 [phone]

## 2021-12-07 NOTE — ED Provider Triage Note (Signed)
Emergency Medicine Provider Triage Evaluation Note  Afton Mikelson, a 88 y.o. male  was evaluated in triage.  Pt complains of persistent diarrhea since Thanksgiving.  Patient does endorse recent antibiotic use.  Denies any associated fevers, chills, nausea or vomiting.  Review of Systems  Positive: diarrhea Negative: FCS  Physical Exam  BP 109/66 (BP Location: Right Arm)   Pulse 85   Temp 97.9 F (36.6 C) (Oral)   Resp 20   SpO2 95%  Gen:   Awake, no distress  NAD Resp:  Normal effort CTA MSK:   Moves extremities without difficulty  Other:    Medical Decision Making  Medically screening exam initiated at 2:10 PM.  Appropriate orders placed.  Kupono Dilorenzo was informed that the remainder of the evaluation will be completed by another provider, this initial triage assessment does not replace that evaluation, and the importance of remaining in the ED until their evaluation is complete.  Geriatric patient to the ED for evaluation of persistent episodes of watery diarrhea.  He denies any fevers, chills, nausea, vomiting.   Lissa Hoard, PA-C 12/07/21 1431

## 2022-03-15 IMAGING — DX DG CHEST 2V
2 series · 2 of 2 positions shown · non-contrast
Comparison: No prior.

CLINICAL DATA: Productive cough.

EXAM:
CHEST - 2 VIEW

[chest pa]
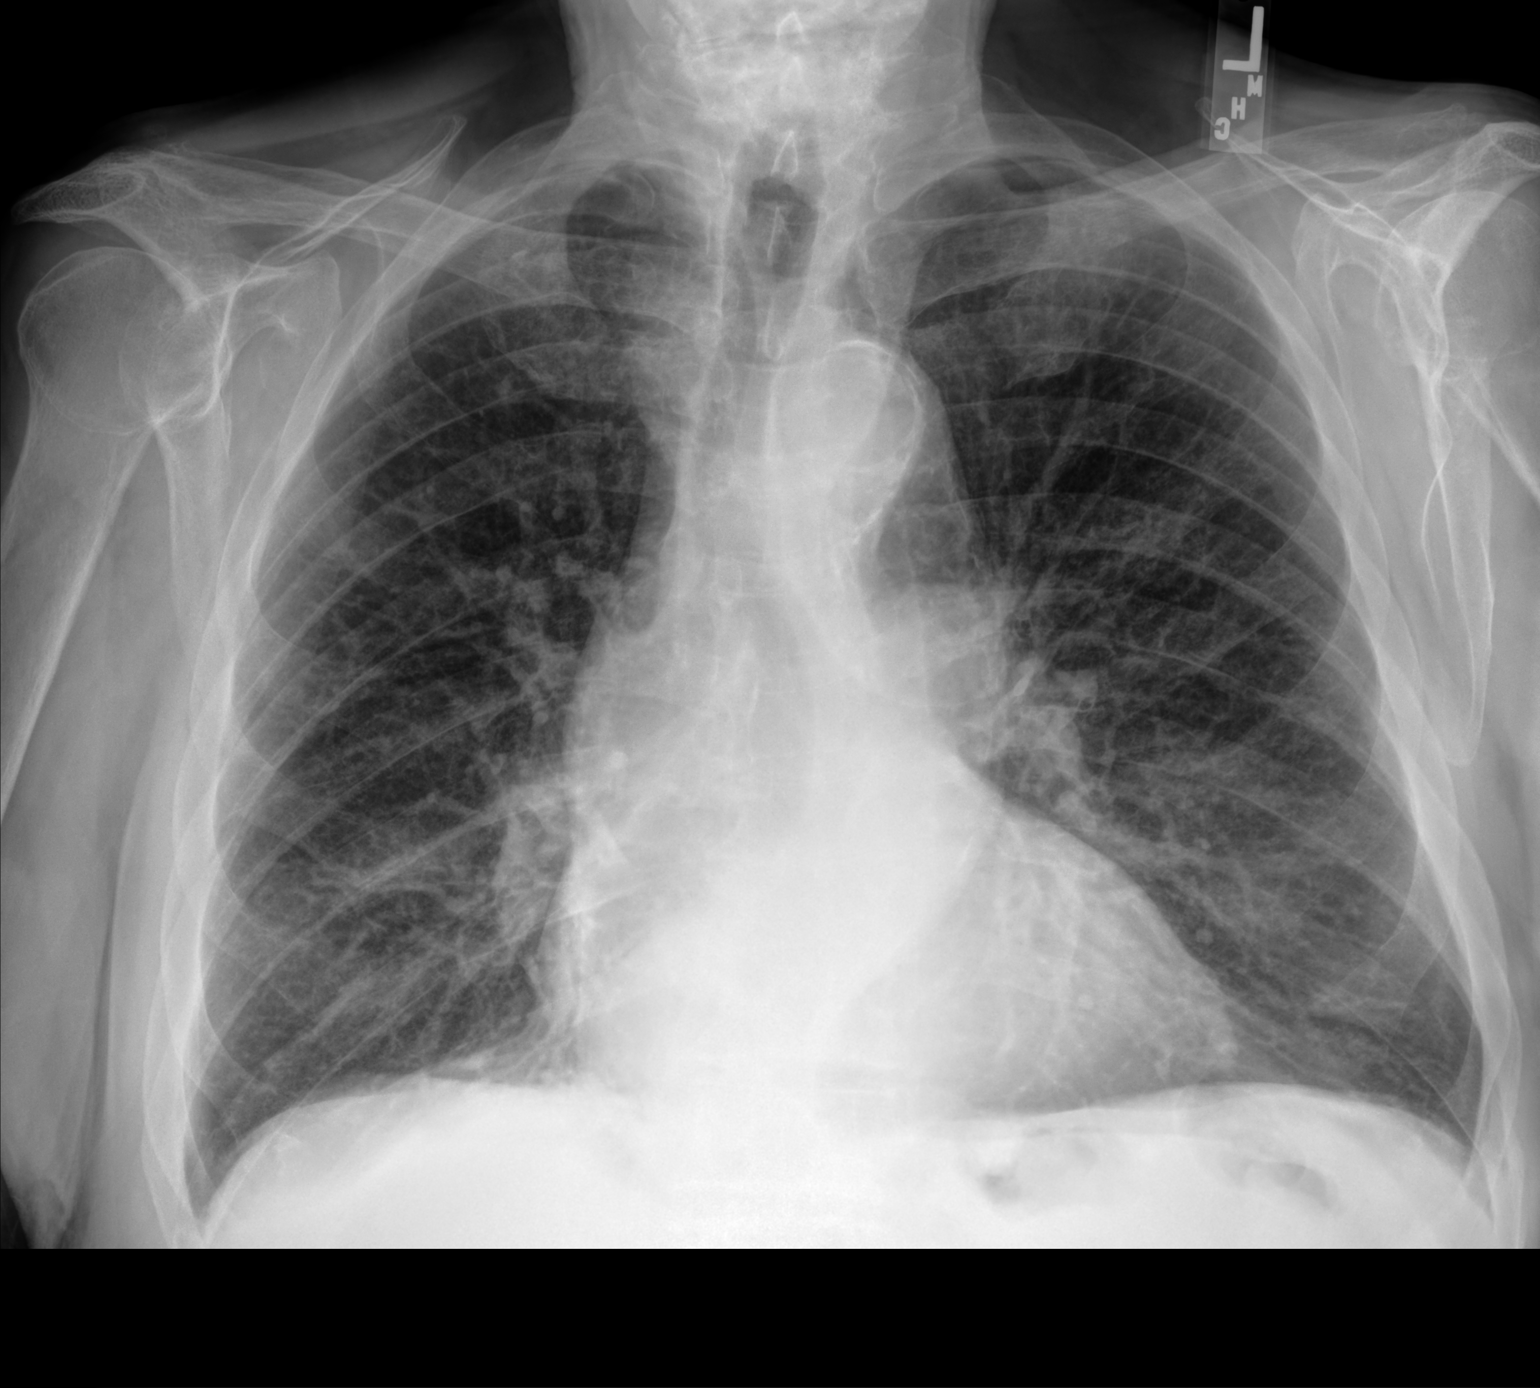

[chest lat]
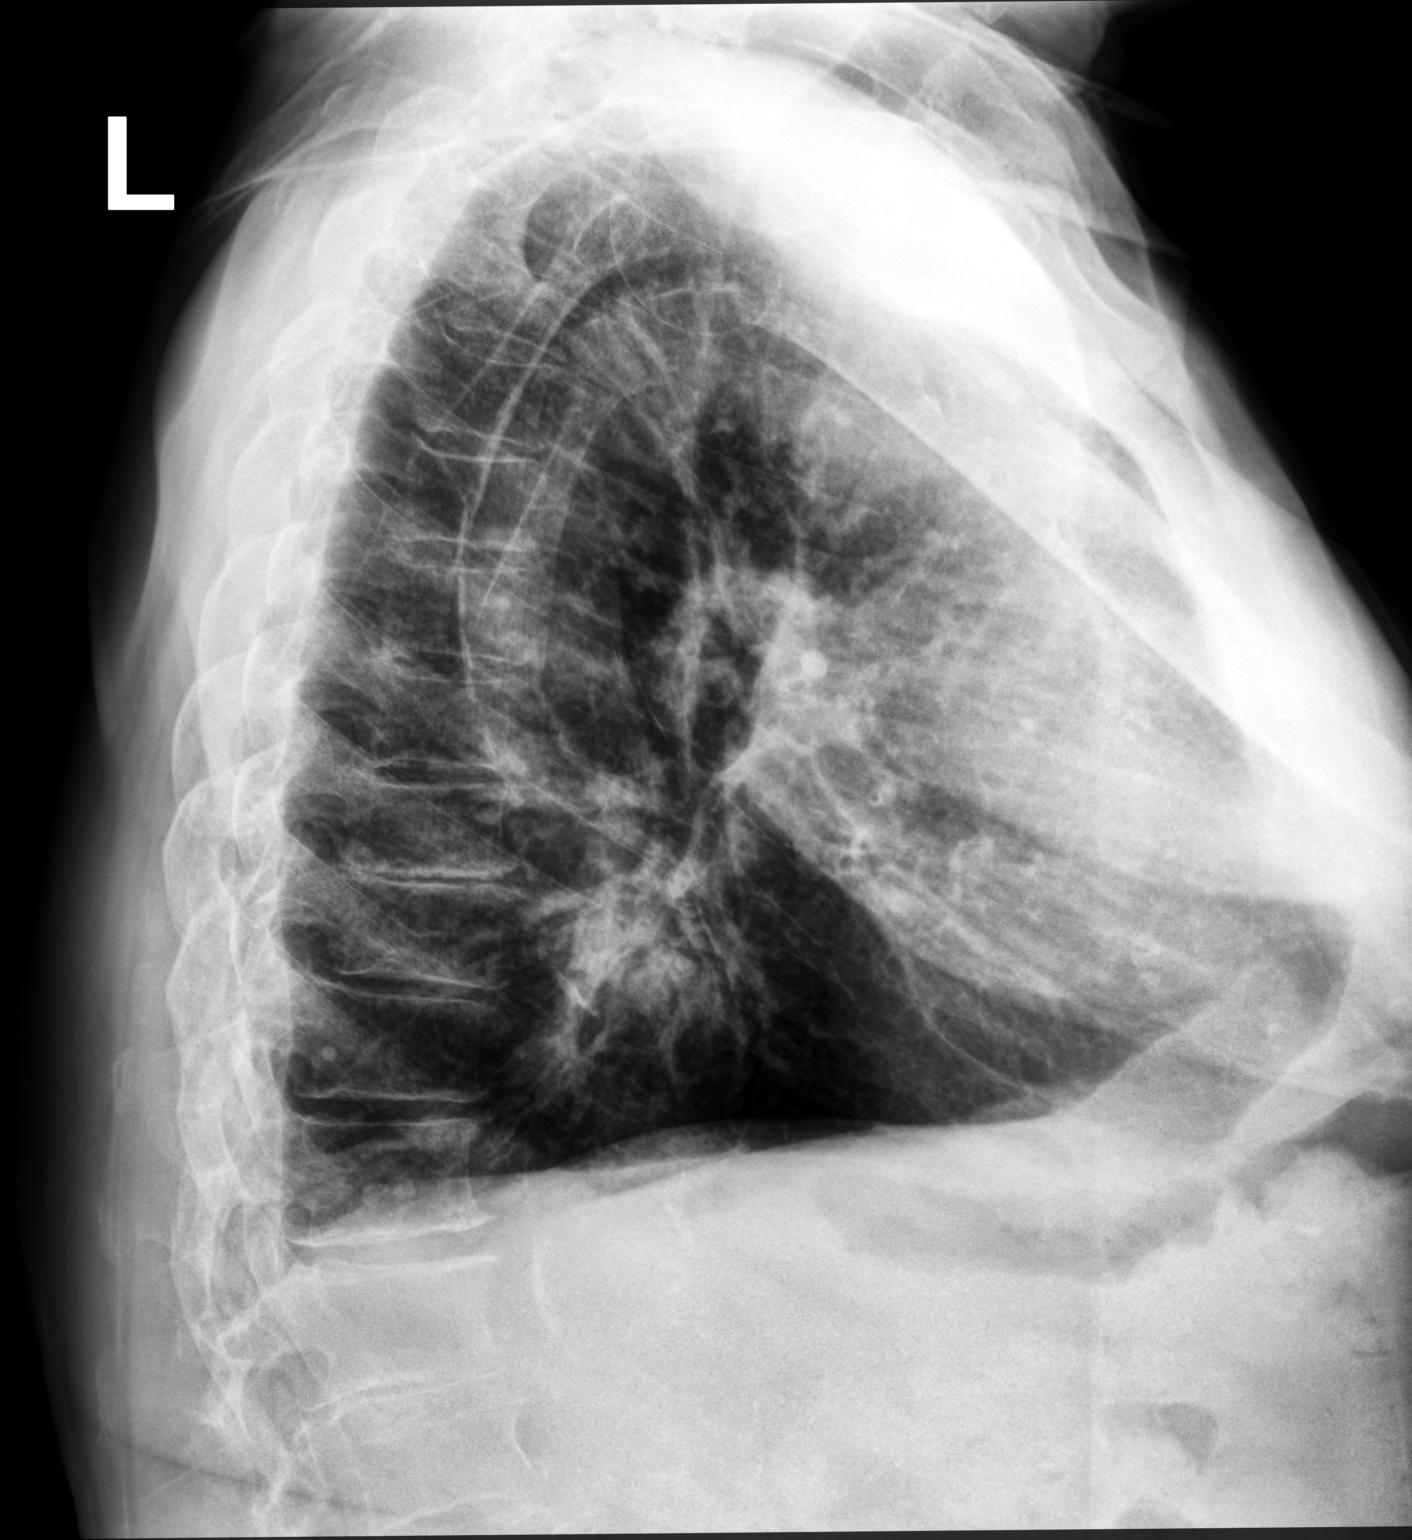

[2 of 2 positions shown; findings below may reference images not displayed]

FINDINGS: Mediastinum and hilar structures normal. Cardiomegaly. Low lung
volumes. Mild bilateral interstitial prominence. Mild interstitial
edema and/or pneumonitis cannot be excluded. No acute bony
abnormality. Degenerative change thoracic spine.
IMPRESSION: 1. Cardiomegaly.
2. Low lung volumes. Mild bilateral interstitial prominence. Mild
interstitial edema and/or pneumonitis cannot be excluded.

## 2022-09-06 ENCOUNTER — Emergency Department
Admission: EM | Admit: 2022-09-06 | Discharge: 2022-09-06 | Disposition: A | Discharge status: Home / Self Care | Payer: No Typology Code available for payment source | Attending: Emergency Medicine | Admitting: Emergency Medicine

## 2022-09-06 ENCOUNTER — Other Ambulatory Visit: Payer: Self-pay

## 2022-09-06 ENCOUNTER — Encounter: Payer: Self-pay | Admitting: Emergency Medicine

## 2022-09-06 ENCOUNTER — Emergency Department: Payer: No Typology Code available for payment source

## 2022-09-06 DIAGNOSIS — R0602 Shortness of breath: Secondary | ICD-10-CM | POA: Insufficient documentation

## 2022-09-06 DIAGNOSIS — I10 Essential (primary) hypertension: Secondary | ICD-10-CM | POA: Diagnosis not present

## 2022-09-06 DIAGNOSIS — I4891 Unspecified atrial fibrillation: Secondary | ICD-10-CM

## 2022-09-06 DIAGNOSIS — I4892 Unspecified atrial flutter: Secondary | ICD-10-CM | POA: Insufficient documentation

## 2022-09-06 DIAGNOSIS — Z1152 Encounter for screening for COVID-19: Secondary | ICD-10-CM | POA: Insufficient documentation

## 2022-09-06 LAB — HEPATIC FUNCTION PANEL
ALT: 14 U/L (ref 0–44)
AST: 16 U/L (ref 15–41)
Albumin: 3.7 g/dL (ref 3.5–5.0)
Alkaline Phosphatase: 82 U/L (ref 38–126)
Bilirubin, Direct: 0.3 mg/dL — ABNORMAL HIGH (ref 0.0–0.2)
Indirect Bilirubin: 1.4 mg/dL — ABNORMAL HIGH (ref 0.3–0.9)
Total Bilirubin: 1.7 mg/dL — ABNORMAL HIGH (ref 0.3–1.2)
Total Protein: 6.6 g/dL (ref 6.5–8.1)

## 2022-09-06 LAB — CBC
HCT: 47.9 % (ref 39.0–52.0)
Hemoglobin: 15.7 g/dL (ref 13.0–17.0)
MCH: 28 pg (ref 26.0–34.0)
MCHC: 32.8 g/dL (ref 30.0–36.0)
MCV: 85.5 fL (ref 80.0–100.0)
Platelets: 186 10*3/uL (ref 150–400)
RBC: 5.6 MIL/uL (ref 4.22–5.81)
RDW: 14.3 % (ref 11.5–15.5)
WBC: 12 10*3/uL — ABNORMAL HIGH (ref 4.0–10.5)
nRBC: 0 % (ref 0.0–0.2)

## 2022-09-06 LAB — BASIC METABOLIC PANEL
Anion gap: 9 (ref 5–15)
BUN: 20 mg/dL (ref 8–23)
CO2: 20 mmol/L — ABNORMAL LOW (ref 22–32)
Calcium: 8.8 mg/dL — ABNORMAL LOW (ref 8.9–10.3)
Chloride: 106 mmol/L (ref 98–111)
Creatinine, Ser: 1.22 mg/dL (ref 0.61–1.24)
GFR, Estimated: 57 mL/min — ABNORMAL LOW (ref >60)
Glucose, Bld: 154 mg/dL — ABNORMAL HIGH (ref 70–99)
Potassium: 4 mmol/L (ref 3.5–5.1)
Sodium: 135 mmol/L (ref 135–145)

## 2022-09-06 LAB — TROPONIN I (HIGH SENSITIVITY)
Troponin I (High Sensitivity): 10 ng/L (ref <18)
Troponin I (High Sensitivity): 10 ng/L (ref <18)

## 2022-09-06 LAB — RESP PANEL BY RT-PCR (RSV, FLU A&B, COVID)  RVPGX2
Influenza A by PCR: NEGATIVE
Influenza B by PCR: NEGATIVE
Resp Syncytial Virus by PCR: NEGATIVE
SARS Coronavirus 2 by RT PCR: NEGATIVE

## 2022-09-06 LAB — TSH: TSH: 1.634 u[IU]/mL (ref 0.350–4.500)

## 2022-09-06 LAB — MAGNESIUM: Magnesium: 2 mg/dL (ref 1.7–2.4)

## 2022-09-06 MED ORDER — IPRATROPIUM-ALBUTEROL 0.5-2.5 (3) MG/3ML IN SOLN
3.0000 mL | Freq: Once | RESPIRATORY_TRACT | Status: AC
  Administered 2022-09-06: 3 mL via RESPIRATORY_TRACT
  Filled 2022-09-06: qty 3

## 2022-09-06 MED ORDER — IOHEXOL 350 MG/ML SOLN
75.0000 mL | Freq: Once | INTRAVENOUS | Status: AC | PRN
  Administered 2022-09-06: 75 mL via INTRAVENOUS

## 2022-09-06 NOTE — Discharge Instructions (Addendum)
As we discussed please be sure to give cardiology a call tomorrow to set up a follow-up appointment.  While you are here in the emergency department there was concern that you were going into an abnormal rhythm called atrial fibrillation.  This while being managed by cardiology.  They will also want to talk to you about whether or not there is benefit for you to starting a blood thinning medication.  In addition please continue to follow-up for your aneurysms and talk to your doctors about repeat imaging. Please return for any shortness of breath, chest pain, abdominal pain or any other new or concerning symptoms.

## 2022-09-06 NOTE — ED Provider Notes (Signed)
[facility] Provider Note    Event Date/Time   First MD Initiated Contact with Patient 09/06/22 1341     (approximate)   History   Shortness of Breath   HPI  Timothy Giles is a 89 year old male with history of HTN, AAA s/p repair, asthma presenting to the emergency department for evaluation of shortness of breath and cough.  Does report some gray-yellow sputum.  No fevers or chills.  No chest pain.  Does report that he was having some issues tolerating the powder of his daily controlling asthma medication so he stopped taking this about a week ago.    Physical Exam   Triage Vital Signs: ED Triage Vitals  Encounter Vitals Group     BP 09/06/22 1011 (!) 172/113     Systolic BP Percentile --      Diastolic BP Percentile --      Pulse Rate 09/06/22 1011 (!) 110     Resp 09/06/22 1011 16     Temp 09/06/22 1011 (!) 97.5 F (36.4 C)     Temp Source 09/06/22 1011 Oral     SpO2 09/06/22 1011 94 %     Weight 09/06/22 1012 197 lb (89.4 kg)     Height 09/06/22 1012 5\' 7"  (1.702 m)     Head Circumference --      Peak Flow --      Pain Score 09/06/22 1011 0     Pain Loc --      Pain Education --      Exclude from Growth Chart --     Most recent vital signs: Vitals:   09/06/22 1415 09/06/22 1500  BP: 119/64 123/77  Pulse: (!) 140 81  Resp: 18 19  Temp:    SpO2: 96% 95%     General: Awake, interactive  CV:  Regular rate, irregular rhythm at the time of my evaluation, though intermittently tachycardic Resp:  Lungs clear, fair air movement without sniffing appreciable wheezing abd:  Soft, nondistended.  Neuro:  Symmetric facial movement, fluid speech   ED Results / Procedures / Treatments   Labs (all labs ordered are listed, but only abnormal results are displayed) Labs Reviewed  BASIC METABOLIC PANEL - Abnormal; Notable for the following components:      Result Value   CO2 20 (*)    Glucose, Bld 154 (*)    Calcium 8.8 (*)    GFR,  Estimated 57 (*)    All other components within normal limits  CBC - Abnormal; Notable for the following components:   WBC 12.0 (*)    All other components within normal limits  HEPATIC FUNCTION PANEL - Abnormal; Notable for the following components:   Total Bilirubin 1.7 (*)    Bilirubin, Direct 0.3 (*)    Indirect Bilirubin 1.4 (*)    All other components within normal limits  RESP PANEL BY RT-PCR (RSV, FLU A&B, COVID)  RVPGX2  MAGNESIUM  TSH  TROPONIN I (HIGH SENSITIVITY)  TROPONIN I (HIGH SENSITIVITY)     EKG EKG independently reviewed interpreted by myself (ER attending) demonstrates:  EKG from 1440 demonstrates a wide QRS tachycardia, suspect A-fib with right bundle branch block at a rate of 148, QRS 137, QTc 517  EKG from 1442 demonstrates what appears to be sinus rhythm with right bundle branch block morphology and frequent PACs, rate 75, PR 134, QRS 150, QTc 410  RADIOLOGY Imaging independently reviewed and interpreted by myself demonstrates:  CXR without focal  consolidation, possible pulmonary vascular congestion noted by radiology  PROCEDURES:  Critical Care performed: No  Procedures   MEDICATIONS ORDERED IN ED: Medications  iohexol (OMNIPAQUE) 350 MG/ML injection 75 mL (75 mLs Intravenous Contrast Given 09/06/22 1539)  ipratropium-albuterol (DUONEB) 0.5-2.5 (3) MG/3ML nebulizer solution 3 mL (3 mLs Nebulization Given 09/06/22 1611)     IMPRESSION / MDM / ASSESSMENT AND PLAN / ED COURSE  I reviewed the triage vital signs and the nursing notes.  Differential diagnosis includes, but is not limited to, pneumonia, pulmonary embolism, anemia, electrolyte abnormality, ACS, asthma exacerbation  Patient's presentation is most consistent with acute presentation with potential threat to life or bodily function.  89 year old male presenting with cough and shortness of breath.  Tachycardic on presentation with what appears to be A-fib at some points, but spontaneously  converts to sinus rhythm with ectopy.  Labs without severe derangement.  Chest x-Elorah Dewing without focal consolidation, possible edema noted.  Given episode of suspected A-fib and shortness of breath, will obtain CT of the chest to further evaluate for PE.  Signed out to oncoming provider pending CTA, reevaluation, and disposition.      FINAL CLINICAL IMPRESSION(S) / ED DIAGNOSES   Final diagnoses:  SOB (shortness of breath)  Atrial fib/flutter, transient (HCC)     Rx / DC Orders   ED Discharge Orders     None        Note:  This document was prepared using Dragon voice recognition software and may include unintentional dictation errors.   Trinna Post, MD 09/06/22 [phone]

## 2022-09-06 NOTE — ED Provider Notes (Signed)
CT scan without PE or pneumonia. Shows aneurysms which patient is aware of.  At this time patient states that he would very much like to be discharged home.  States he is feeling slightly better.  Think this is reasonable.  I did have a discussion with the patient about importance of very close follow-up with cardiology.  I do think a discussion should be had with cardiologist prior to anticoagulation given history of aneurysms.  Additionally discussed with patient portance of continued follow-up for aneurysm monitoring.   Phineas Semen, MD 09/06/22 [phone]

## 2022-09-06 NOTE — ED Triage Notes (Signed)
Sob for 2 days.  No fever.  Cough.  Uses albuterol inhaler.  Cough up grey/yellow sputum.

## 2022-09-10 NOTE — Group Note (Deleted)
